# Patient Record
Sex: Female | Born: 1965 | Race: White | Hispanic: No | Smoking: Never smoker
Health system: Southern US, Community
[De-identification: ages and names within clinical notes are randomized; demographics above are authoritative.]

## PROBLEM LIST (undated history)

## (undated) DIAGNOSIS — Z8679 Personal history of other diseases of the circulatory system: Secondary | ICD-10-CM

## (undated) DIAGNOSIS — R42 Dizziness and giddiness: Secondary | ICD-10-CM

## (undated) DIAGNOSIS — M25569 Pain in unspecified knee: Secondary | ICD-10-CM

## (undated) DIAGNOSIS — B351 Tinea unguium: Secondary | ICD-10-CM

## (undated) DIAGNOSIS — B977 Papillomavirus as the cause of diseases classified elsewhere: Secondary | ICD-10-CM

## (undated) DIAGNOSIS — G4700 Insomnia, unspecified: Secondary | ICD-10-CM

## (undated) DIAGNOSIS — I10 Essential (primary) hypertension: Secondary | ICD-10-CM

## (undated) DIAGNOSIS — R87619 Unspecified abnormal cytological findings in specimens from cervix uteri: Secondary | ICD-10-CM

## (undated) DIAGNOSIS — F419 Anxiety disorder, unspecified: Secondary | ICD-10-CM

## (undated) DIAGNOSIS — M79603 Pain in arm, unspecified: Secondary | ICD-10-CM

## (undated) DIAGNOSIS — E785 Hyperlipidemia, unspecified: Secondary | ICD-10-CM

## (undated) DIAGNOSIS — I2510 Atherosclerotic heart disease of native coronary artery without angina pectoris: Secondary | ICD-10-CM

## (undated) DIAGNOSIS — K219 Gastro-esophageal reflux disease without esophagitis: Secondary | ICD-10-CM

## (undated) DIAGNOSIS — F1910 Other psychoactive substance abuse, uncomplicated: Secondary | ICD-10-CM

## (undated) HISTORY — DX: Pain in arm, unspecified: M79.603

## (undated) HISTORY — DX: Gastro-esophageal reflux disease without esophagitis: K21.9

## (undated) HISTORY — DX: Pain in unspecified knee: M25.569

## (undated) HISTORY — DX: Other psychoactive substance abuse, uncomplicated: F19.10

## (undated) HISTORY — DX: Insomnia, unspecified: G47.00

## (undated) HISTORY — DX: Hyperlipidemia, unspecified: E78.5

## (undated) HISTORY — DX: Tinea unguium: B35.1

## (undated) HISTORY — DX: Unspecified abnormal cytological findings in specimens from cervix uteri: R87.619

## (undated) HISTORY — DX: Papillomavirus as the cause of diseases classified elsewhere: B97.7

## (undated) HISTORY — DX: Personal history of other diseases of the circulatory system: Z86.79

## (undated) HISTORY — DX: Dizziness and giddiness: R42

## (undated) HISTORY — PX: AORTA SURGERY: SHX548

## (undated) HISTORY — DX: Anxiety disorder, unspecified: F41.9

---

## 2000-02-22 ENCOUNTER — Encounter: Payer: Self-pay | Admitting: Emergency Medicine

## 2000-02-22 ENCOUNTER — Emergency Department (HOSPITAL_COMMUNITY): Admission: EM | Admit: 2000-02-22 | Discharge: 2000-02-22 | Payer: Self-pay | Admitting: Emergency Medicine

## 2004-01-28 ENCOUNTER — Emergency Department (HOSPITAL_COMMUNITY): Admission: EM | Admit: 2004-01-28 | Discharge: 2004-01-28 | Payer: Self-pay | Admitting: Emergency Medicine

## 2005-08-04 ENCOUNTER — Emergency Department (HOSPITAL_COMMUNITY): Admission: EM | Admit: 2005-08-04 | Discharge: 2005-08-04 | Payer: Self-pay | Admitting: Emergency Medicine

## 2006-02-25 ENCOUNTER — Emergency Department (HOSPITAL_COMMUNITY): Admission: EM | Admit: 2006-02-25 | Discharge: 2006-02-25 | Payer: Self-pay

## 2006-06-06 ENCOUNTER — Emergency Department (HOSPITAL_COMMUNITY): Admission: EM | Admit: 2006-06-06 | Discharge: 2006-06-06 | Payer: Self-pay | Admitting: Emergency Medicine

## 2009-02-09 ENCOUNTER — Inpatient Hospital Stay (HOSPITAL_COMMUNITY): Admission: EM | Admit: 2009-02-09 | Discharge: 2009-02-18 | Payer: Self-pay | Admitting: Emergency Medicine

## 2009-02-09 ENCOUNTER — Encounter: Payer: Self-pay | Admitting: Thoracic Surgery (Cardiothoracic Vascular Surgery)

## 2009-02-09 HISTORY — PX: AORTA SURGERY: SHX548

## 2009-02-10 ENCOUNTER — Ambulatory Visit: Payer: Self-pay | Admitting: Thoracic Surgery (Cardiothoracic Vascular Surgery)

## 2009-02-14 ENCOUNTER — Encounter (INDEPENDENT_AMBULATORY_CARE_PROVIDER_SITE_OTHER): Payer: Self-pay | Admitting: Cardiology

## 2009-03-13 ENCOUNTER — Ambulatory Visit: Payer: Self-pay | Admitting: Thoracic Surgery (Cardiothoracic Vascular Surgery)

## 2009-03-13 ENCOUNTER — Encounter
Admission: RE | Admit: 2009-03-13 | Discharge: 2009-03-13 | Payer: Self-pay | Admitting: Thoracic Surgery (Cardiothoracic Vascular Surgery)

## 2009-04-06 ENCOUNTER — Encounter (HOSPITAL_COMMUNITY): Admission: RE | Admit: 2009-04-06 | Discharge: 2009-06-20 | Payer: Self-pay | Admitting: Cardiology

## 2009-05-02 ENCOUNTER — Ambulatory Visit: Payer: Self-pay | Admitting: Thoracic Surgery (Cardiothoracic Vascular Surgery)

## 2009-05-02 ENCOUNTER — Ambulatory Visit (HOSPITAL_COMMUNITY)
Admission: RE | Admit: 2009-05-02 | Discharge: 2009-05-02 | Payer: Self-pay | Admitting: Thoracic Surgery (Cardiothoracic Vascular Surgery)

## 2009-08-07 ENCOUNTER — Encounter
Admission: RE | Admit: 2009-08-07 | Discharge: 2009-08-07 | Payer: Self-pay | Admitting: Thoracic Surgery (Cardiothoracic Vascular Surgery)

## 2009-08-07 ENCOUNTER — Ambulatory Visit: Payer: Self-pay | Admitting: Thoracic Surgery (Cardiothoracic Vascular Surgery)

## 2010-01-18 ENCOUNTER — Ambulatory Visit: Payer: Self-pay | Admitting: Family Medicine

## 2010-03-18 ENCOUNTER — Emergency Department (HOSPITAL_COMMUNITY): Admission: EM | Admit: 2010-03-18 | Discharge: 2010-03-18 | Payer: Self-pay | Admitting: Emergency Medicine

## 2010-04-12 ENCOUNTER — Ambulatory Visit: Payer: Self-pay | Admitting: Internal Medicine

## 2010-04-16 ENCOUNTER — Emergency Department (HOSPITAL_COMMUNITY): Admission: EM | Admit: 2010-04-16 | Discharge: 2010-04-16 | Payer: Self-pay | Admitting: Emergency Medicine

## 2010-07-14 ENCOUNTER — Other Ambulatory Visit: Payer: Self-pay | Admitting: Thoracic Surgery (Cardiothoracic Vascular Surgery)

## 2010-07-26 ENCOUNTER — Inpatient Hospital Stay: Admission: RE | Admit: 2010-07-26 | Payer: Self-pay | Source: Ambulatory Visit

## 2010-07-26 ENCOUNTER — Encounter: Payer: Self-pay | Admitting: Thoracic Surgery (Cardiothoracic Vascular Surgery)

## 2010-07-26 ENCOUNTER — Ambulatory Visit: Admit: 2010-07-26 | Payer: Self-pay | Admitting: Thoracic Surgery (Cardiothoracic Vascular Surgery)

## 2010-07-26 ENCOUNTER — Other Ambulatory Visit: Payer: Self-pay

## 2010-08-07 ENCOUNTER — Encounter: Payer: Self-pay | Admitting: Thoracic Surgery (Cardiothoracic Vascular Surgery)

## 2010-09-01 ENCOUNTER — Emergency Department (HOSPITAL_COMMUNITY): Payer: Self-pay

## 2010-09-01 ENCOUNTER — Emergency Department (HOSPITAL_COMMUNITY)
Admission: EM | Admit: 2010-09-01 | Discharge: 2010-09-01 | Disposition: A | Discharge status: Home / Self Care | Payer: Self-pay | Attending: Emergency Medicine | Admitting: Emergency Medicine

## 2010-09-01 DIAGNOSIS — I10 Essential (primary) hypertension: Secondary | ICD-10-CM | POA: Insufficient documentation

## 2010-09-01 DIAGNOSIS — Y92009 Unspecified place in unspecified non-institutional (private) residence as the place of occurrence of the external cause: Secondary | ICD-10-CM | POA: Insufficient documentation

## 2010-09-01 DIAGNOSIS — S92909A Unspecified fracture of unspecified foot, initial encounter for closed fracture: Secondary | ICD-10-CM | POA: Insufficient documentation

## 2010-09-01 DIAGNOSIS — R296 Repeated falls: Secondary | ICD-10-CM | POA: Insufficient documentation

## 2010-09-01 DIAGNOSIS — M79609 Pain in unspecified limb: Secondary | ICD-10-CM | POA: Insufficient documentation

## 2010-09-05 LAB — URINALYSIS, ROUTINE W REFLEX MICROSCOPIC
Bilirubin Urine: NEGATIVE
Glucose, UA: NEGATIVE mg/dL
Hgb urine dipstick: NEGATIVE
Ketones, ur: NEGATIVE mg/dL
Nitrite: NEGATIVE
Protein, ur: NEGATIVE mg/dL
Specific Gravity, Urine: 1.006 (ref 1.005–1.030)
Urobilinogen, UA: 0.2 mg/dL (ref 0.0–1.0)
pH: 6 (ref 5.0–8.0)

## 2010-09-05 LAB — BASIC METABOLIC PANEL
BUN: 10 mg/dL (ref 6–23)
CO2: 26 mEq/L (ref 19–32)
Calcium: 8.7 mg/dL (ref 8.4–10.5)
Chloride: 108 mEq/L (ref 96–112)
Creatinine, Ser: 1.12 mg/dL (ref 0.4–1.2)
GFR calc Af Amer: >60 mL/min (ref >60)
GFR calc non Af Amer: 53 mL/min — ABNORMAL LOW (ref >60)
Glucose, Bld: 107 mg/dL — ABNORMAL HIGH (ref 70–99)
Potassium: 5.2 mEq/L — ABNORMAL HIGH (ref 3.5–5.1)
Sodium: 138 mEq/L (ref 135–145)

## 2010-09-05 LAB — CBC
HCT: 36.9 % (ref 36.0–46.0)
Hemoglobin: 12.4 g/dL (ref 12.0–15.0)
MCH: 30.8 pg (ref 26.0–34.0)
MCHC: 33.6 g/dL (ref 30.0–36.0)
MCV: 91.8 fL (ref 78.0–100.0)
Platelets: 285 10*3/uL (ref 150–400)
RBC: 4.02 MIL/uL (ref 3.87–5.11)
RDW: 12.9 % (ref 11.5–15.5)
WBC: 8.3 10*3/uL (ref 4.0–10.5)

## 2010-09-05 LAB — DIFFERENTIAL
Basophils Absolute: 0 10*3/uL (ref 0.0–0.1)
Basophils Relative: 0 % (ref 0–1)
Eosinophils Absolute: 0.1 10*3/uL (ref 0.0–0.7)
Eosinophils Relative: 2 % (ref 0–5)
Lymphocytes Relative: 15 % (ref 12–46)
Monocytes Absolute: 0.5 10*3/uL (ref 0.1–1.0)
Neutro Abs: 6.4 10*3/uL (ref 1.7–7.7)

## 2010-09-05 LAB — POCT PREGNANCY, URINE: Preg Test, Ur: NEGATIVE

## 2010-09-18 ENCOUNTER — Encounter (INDEPENDENT_AMBULATORY_CARE_PROVIDER_SITE_OTHER): Payer: Self-pay | Admitting: *Deleted

## 2010-09-18 LAB — CONVERTED CEMR LAB
Albumin: 4.5 g/dL (ref 3.5–5.2)
CO2: 25 meq/L (ref 19–32)
Chloride: 101 meq/L (ref 96–112)
Creatinine, Ser: 1.05 mg/dL (ref 0.40–1.20)
Total Bilirubin: 0.4 mg/dL (ref 0.3–1.2)
Total Protein: 7.5 g/dL (ref 6.0–8.3)

## 2010-09-29 LAB — CBC
HCT: 25.5 % — ABNORMAL LOW (ref 36.0–46.0)
HCT: 29.6 % — ABNORMAL LOW (ref 36.0–46.0)
HCT: 30 % — ABNORMAL LOW (ref 36.0–46.0)
HCT: 43.3 % (ref 36.0–46.0)
Hemoglobin: 10.1 g/dL — ABNORMAL LOW (ref 12.0–15.0)
Hemoglobin: 10.6 g/dL — ABNORMAL LOW (ref 12.0–15.0)
Hemoglobin: 14.4 g/dL (ref 12.0–15.0)
Hemoglobin: 8.8 g/dL — ABNORMAL LOW (ref 12.0–15.0)
MCHC: 33.3 g/dL (ref 30.0–36.0)
MCHC: 33.8 g/dL (ref 30.0–36.0)
MCHC: 34.1 g/dL (ref 30.0–36.0)
MCV: 84.8 fL (ref 78.0–100.0)
MCV: 85.9 fL (ref 78.0–100.0)
MCV: 87.4 fL (ref 78.0–100.0)
Platelets: 110 10*3/uL — ABNORMAL LOW (ref 150–400)
Platelets: 137 10*3/uL — ABNORMAL LOW (ref 150–400)
RBC: 3.02 MIL/uL — ABNORMAL LOW (ref 3.87–5.11)
RBC: 3.43 MIL/uL — ABNORMAL LOW (ref 3.87–5.11)
RBC: 3.58 MIL/uL — ABNORMAL LOW (ref 3.87–5.11)
RBC: 5.04 MIL/uL (ref 3.87–5.11)
RDW: 16 % — ABNORMAL HIGH (ref 11.5–15.5)
RDW: 16.3 % — ABNORMAL HIGH (ref 11.5–15.5)
RDW: 16.4 % — ABNORMAL HIGH (ref 11.5–15.5)
WBC: 6.5 10*3/uL (ref 4.0–10.5)
WBC: 9.8 10*3/uL (ref 4.0–10.5)

## 2010-09-29 LAB — CROSSMATCH: Antibody Screen: NEGATIVE

## 2010-09-29 LAB — POCT I-STAT 4, (NA,K, GLUC, HGB,HCT)
Glucose, Bld: 137 mg/dL — ABNORMAL HIGH (ref 70–99)
Glucose, Bld: 141 mg/dL — ABNORMAL HIGH (ref 70–99)
Glucose, Bld: 149 mg/dL — ABNORMAL HIGH (ref 70–99)
Glucose, Bld: 156 mg/dL — ABNORMAL HIGH (ref 70–99)
Glucose, Bld: 180 mg/dL — ABNORMAL HIGH (ref 70–99)
Glucose, Bld: 83 mg/dL (ref 70–99)
HCT: 22 % — ABNORMAL LOW (ref 36.0–46.0)
HCT: 31 % — ABNORMAL LOW (ref 36.0–46.0)
HCT: 38 % (ref 36.0–46.0)
Hemoglobin: 12.9 g/dL (ref 12.0–15.0)
Hemoglobin: 6.8 g/dL — CL (ref 12.0–15.0)
Hemoglobin: 6.8 g/dL — CL (ref 12.0–15.0)
Hemoglobin: 7.1 g/dL — CL (ref 12.0–15.0)
Hemoglobin: 8.2 g/dL — ABNORMAL LOW (ref 12.0–15.0)
Potassium: 2.8 mEq/L — ABNORMAL LOW (ref 3.5–5.1)
Potassium: 3 mEq/L — ABNORMAL LOW (ref 3.5–5.1)
Potassium: 3.3 mEq/L — ABNORMAL LOW (ref 3.5–5.1)
Potassium: 3.4 mEq/L — ABNORMAL LOW (ref 3.5–5.1)
Potassium: 3.8 mEq/L (ref 3.5–5.1)
Sodium: 125 mEq/L — ABNORMAL LOW (ref 135–145)
Sodium: 125 mEq/L — ABNORMAL LOW (ref 135–145)
Sodium: 130 mEq/L — ABNORMAL LOW (ref 135–145)
Sodium: 133 mEq/L — ABNORMAL LOW (ref 135–145)
Sodium: 135 mEq/L (ref 135–145)
Sodium: 137 mEq/L (ref 135–145)
Sodium: 144 mEq/L (ref 135–145)

## 2010-09-29 LAB — POCT I-STAT 3, ART BLOOD GAS (G3+)
Acid-base deficit: 1 mmol/L (ref 0.0–2.0)
Acid-base deficit: 1 mmol/L (ref 0.0–2.0)
Acid-base deficit: 1 mmol/L (ref 0.0–2.0)
Bicarbonate: 23.6 mEq/L (ref 20.0–24.0)
Bicarbonate: 23.7 mEq/L (ref 20.0–24.0)
Bicarbonate: 24 mEq/L (ref 20.0–24.0)
Bicarbonate: 25.1 mEq/L — ABNORMAL HIGH (ref 20.0–24.0)
Bicarbonate: 25.3 mEq/L — ABNORMAL HIGH (ref 20.0–24.0)
O2 Saturation: 100 %
O2 Saturation: 100 %
O2 Saturation: 100 %
Patient temperature: 37.4
TCO2: 22 mmol/L (ref 0–100)
TCO2: 25 mmol/L (ref 0–100)
TCO2: 25 mmol/L (ref 0–100)
pCO2 arterial: 33.6 mmHg — ABNORMAL LOW (ref 35.0–45.0)
pCO2 arterial: 37.3 mmHg (ref 35.0–45.0)
pCO2 arterial: 39.5 mmHg (ref 35.0–45.0)
pCO2 arterial: 41.6 mmHg (ref 35.0–45.0)
pCO2 arterial: 47.5 mmHg — ABNORMAL HIGH (ref 35.0–45.0)
pH, Arterial: 7.34 — ABNORMAL LOW (ref 7.350–7.400)
pH, Arterial: 7.389 (ref 7.350–7.400)
pH, Arterial: 7.41 — ABNORMAL HIGH (ref 7.350–7.400)
pH, Arterial: 7.411 — ABNORMAL HIGH (ref 7.350–7.400)
pO2, Arterial: 350 mmHg — ABNORMAL HIGH (ref 80.0–100.0)
pO2, Arterial: 439 mmHg — ABNORMAL HIGH (ref 80.0–100.0)
pO2, Arterial: 62 mmHg — ABNORMAL LOW (ref 80.0–100.0)
pO2, Arterial: 99 mmHg (ref 80.0–100.0)

## 2010-09-29 LAB — MRSA PCR SCREENING: MRSA by PCR: NEGATIVE

## 2010-09-29 LAB — BASIC METABOLIC PANEL
BUN: 10 mg/dL (ref 6–23)
BUN: 7 mg/dL (ref 6–23)
CO2: 24 mEq/L (ref 19–32)
CO2: 27 mEq/L (ref 19–32)
Calcium: 7.8 mg/dL — ABNORMAL LOW (ref 8.4–10.5)
Chloride: 102 mEq/L (ref 96–112)
Chloride: 105 mEq/L (ref 96–112)
Chloride: 106 mEq/L (ref 96–112)
Creatinine, Ser: 0.62 mg/dL (ref 0.4–1.2)
Creatinine, Ser: 0.69 mg/dL (ref 0.4–1.2)
Creatinine, Ser: 0.81 mg/dL (ref 0.4–1.2)
GFR calc Af Amer: >60 mL/min (ref >60)
GFR calc Af Amer: >60 mL/min (ref >60)
GFR calc non Af Amer: >60 mL/min (ref >60)
Glucose, Bld: 101 mg/dL — ABNORMAL HIGH (ref 70–99)
Glucose, Bld: 138 mg/dL — ABNORMAL HIGH (ref 70–99)
Glucose, Bld: 146 mg/dL — ABNORMAL HIGH (ref 70–99)
Glucose, Bld: 151 mg/dL — ABNORMAL HIGH (ref 70–99)
Potassium: 3.6 mEq/L (ref 3.5–5.1)
Potassium: 3.7 mEq/L (ref 3.5–5.1)
Potassium: 4.1 mEq/L (ref 3.5–5.1)
Sodium: 144 mEq/L (ref 135–145)

## 2010-09-29 LAB — POCT PREGNANCY, URINE: Preg Test, Ur: NEGATIVE

## 2010-09-29 LAB — POCT I-STAT, CHEM 8
BUN: 8 mg/dL (ref 6–23)
Calcium, Ion: 1 mmol/L — ABNORMAL LOW (ref 1.12–1.32)
Calcium, Ion: 1.1 mmol/L — ABNORMAL LOW (ref 1.12–1.32)
Chloride: 104 mEq/L (ref 96–112)
Chloride: 109 mEq/L (ref 96–112)
Creatinine, Ser: 0.8 mg/dL (ref 0.4–1.2)
Glucose, Bld: 107 mg/dL — ABNORMAL HIGH (ref 70–99)
Glucose, Bld: 158 mg/dL — ABNORMAL HIGH (ref 70–99)
HCT: 30 % — ABNORMAL LOW (ref 36.0–46.0)
HCT: 46 % (ref 36.0–46.0)
Hemoglobin: 10.2 g/dL — ABNORMAL LOW (ref 12.0–15.0)
Hemoglobin: 15.6 g/dL — ABNORMAL HIGH (ref 12.0–15.0)
Sodium: 141 mEq/L (ref 135–145)
TCO2: 21 mmol/L (ref 0–100)
TCO2: 22 mmol/L (ref 0–100)
TCO2: 22 mmol/L (ref 0–100)

## 2010-09-29 LAB — RAPID URINE DRUG SCREEN, HOSP PERFORMED
Amphetamines: NOT DETECTED
Barbiturates: NOT DETECTED
Benzodiazepines: NOT DETECTED
Cocaine: POSITIVE — AB

## 2010-09-29 LAB — PREPARE FRESH FROZEN PLASMA

## 2010-09-29 LAB — URINE MICROSCOPIC-ADD ON

## 2010-09-29 LAB — DIFFERENTIAL
Basophils Absolute: 0.1 10*3/uL (ref 0.0–0.1)
Lymphocytes Relative: 17 % (ref 12–46)
Monocytes Absolute: 0.9 10*3/uL (ref 0.1–1.0)
Neutrophils Relative %: 73 % (ref 43–77)

## 2010-09-29 LAB — PROTIME-INR: Prothrombin Time: 12.4 seconds (ref 11.6–15.2)

## 2010-09-29 LAB — POCT I-STAT 3, VENOUS BLOOD GAS (G3P V)
Acid-base deficit: 4 mmol/L — ABNORMAL HIGH (ref 0.0–2.0)
O2 Saturation: 97 %
TCO2: 24 mmol/L (ref 0–100)
pCO2, Ven: 45.3 mmHg (ref 45.0–50.0)
pCO2, Ven: 56.8 mmHg — ABNORMAL HIGH (ref 45.0–50.0)
pH, Ven: 7.244 — ABNORMAL LOW (ref 7.250–7.300)
pO2, Ven: 60 mmHg — ABNORMAL HIGH (ref 30.0–45.0)

## 2010-09-29 LAB — CREATININE, SERUM
Creatinine, Ser: 0.95 mg/dL (ref 0.4–1.2)
GFR calc Af Amer: >60 mL/min (ref >60)
GFR calc non Af Amer: >60 mL/min (ref >60)

## 2010-09-29 LAB — HEMOGLOBIN AND HEMATOCRIT, BLOOD: HCT: 24.1 % — ABNORMAL LOW (ref 36.0–46.0)

## 2010-09-29 LAB — URINALYSIS, ROUTINE W REFLEX MICROSCOPIC
Bilirubin Urine: NEGATIVE
Leukocytes, UA: NEGATIVE
Urobilinogen, UA: 0.2 mg/dL (ref 0.0–1.0)

## 2010-09-29 LAB — GLUCOSE, CAPILLARY
Glucose-Capillary: 120 mg/dL — ABNORMAL HIGH (ref 70–99)
Glucose-Capillary: 127 mg/dL — ABNORMAL HIGH (ref 70–99)
Glucose-Capillary: 129 mg/dL — ABNORMAL HIGH (ref 70–99)
Glucose-Capillary: 130 mg/dL — ABNORMAL HIGH (ref 70–99)
Glucose-Capillary: 152 mg/dL — ABNORMAL HIGH (ref 70–99)
Glucose-Capillary: 216 mg/dL — ABNORMAL HIGH (ref 70–99)

## 2010-09-29 LAB — PREPARE PLATELETS

## 2010-09-29 LAB — MAGNESIUM: Magnesium: 2.3 mg/dL (ref 1.5–2.5)

## 2010-09-29 LAB — PLATELET COUNT: Platelets: 139 10*3/uL — ABNORMAL LOW (ref 150–400)

## 2010-09-29 LAB — ABO/RH: ABO/RH(D): A POS

## 2010-11-06 NOTE — Consult Note (Signed)
[name], [name]              ACCOUNT NO.:  [id number]   MEDICAL RECORD NO.:  [id number]          PATIENT TYPE:  INP   LOCATION:  3311                         FACILITY:  [facility]   PHYSICIAN:  Francisca December, M.D.  DATE OF BIRTH:  30-Mar-1966   DATE OF CONSULTATION:  DATE OF DISCHARGE:                                 CONSULTATION   REQUESTING PHYSICIAN:  Dr. Charlett Lango.   REASON FOR CONSULTATION:  Hypertension management.   HISTORY OF PRESENT ILLNESS:  Joan [name] is a pleasant 45 year old  woman with a long history of prior hypertension uncontrolled and  untreated secondary to noncompliance.  She presented on February 09, 2009  to [facility] emergency room complaining of persistent unrelenting chest  pain radiating through to her upper back.  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  She was taken urgently to surgery on February 10, 2009, and had a  #28 Hemashield aortic - aortic anastomosis with reimplantation of the  innominate artery.  She is now postop day  #4 and doing extremely well.  She has been recovering without  complication, and is now in the step-down unit.   She admits to a long history of hypertension, but due to financial and  other reasons has never had it treated.  She denies any history of  cardiac disease that she is aware of, and has not had chest pain or  pressure, exertional symptoms prior to her presentation here on the  19th.  She denies dyspnea prior to her operation, although she is having  difficulty breathing now secondary to her chest wall pain.  She has  never had a stroke, and she has not had a kidney disorder that she is  aware of.   PAST MEDICAL HISTORY:  Hypertension.   MEDICATIONS:  1. Lisinopril 20 mg p.o. daily.  2. Metoprolol 75 mg p.o. b.i.d.  3. Clonidine 0.3 mg b.i.d.   DRUG  ALLERGIES:  None known.   FAMILY HISTORY:  Her mother died at age 8 of myocardial infarction, but  had been a long-time smoker and had severe emphysema.  Her father is  alive and well.  Suffers from anxiety.  Her brother has bipolar  disorder.   SOCIAL HISTORY:  She is a nonsmoker, drinks an occasional pina colada.  No drug use.  She used to work cleaning houses.  Currently unemployed.  She is not a high school graduate, but is working on her GED.   REVIEW OF SYSTEMS:  She denies any chronic headache or visual changes.  No cough or hemoptysis.  No difficulty swallowing.  No significant  dental problems.  She has not had any swollen lymph glands that she is  aware of.  She denies a cough or hemoptysis prior to her surgery.  No  wheezing.  No chest pain, pressure or heaviness.  She has not been short  of breath with daily exertions.  No lower extremity edema.  No abdominal  pain, constipation or diarrhea.  No neck, hematochezia, hematemesis or  melena.  No dysuria or hematuria.  No significant muscle aches or  weakness.  No significant joint pain.  No hypesthesia or paresthesia.  She has never had a stroke that she is aware of, and denies depression  or anxiety.   PHYSICAL EXAMINATION:  Blood pressure today is 138/86, pulse is 82 and  regular, respiratory rate is 28, temperature 98.1, O2 saturation 95% on  2 liters.  GENERAL:  This is a pleasant well-appearing 45 year old  Caucasian woman in no distress, well-kempt.  HEENT:  Unremarkable.  Head is atraumatic and normocephalic.  Pupils are  equal, round, and reactive to light.  Sclerae are anicteric.  Oral  mucosa is pink and moist.  Tongue is not coated.  NECK:  Supple without thyromegaly or masses.  The carotid upstrokes are  normal without bruit.  There is no JVD.  CHEST:  Has diminished breath sounds bilaterally due to pleuritic pain  from her surgical site.  No wheezes or rhonchi.  HEART:  Has regular rhythm, normal S1 and S2 is  heard.  No murmur, click  or rub.  ABDOMEN:  Soft, nontender.  No midline pulsatile mass.  Bowel sounds are  present in all quadrants.  EXTERNAL GENITALIA:  Without lesions.  RECTAL:  Not performed.  EXTREMITIES:  Show full range of motion.  No edema.  Intact distal  pulses.  NEUROLOGICAL:  Cranial nerves II-XII are intact.  Motor and sensory are  grossly intact.  Gait not tested.  SKIN:  Warm, dry, and clear.   ACCESSORY CLINICAL DATA:  Her immediate postop hemogram showed  hemoglobin of 6.8, hematocrit of 20, white blood cell count of 10,400,  platelets 296,000.  Following 2 units of packed red blood cells, the hemoglobin is now 10.9.  White blood cell count today 13,00, platelets 122,000 today.  CBGs have  all been mildly elevated in the 120 range.  Serum electrolytes are  normal.  BUN is 10, creatinine 0.62.  Urinalysis is negative.   Electrocardiogram shows sinus rhythm, incomplete right bundle branch  block, nonspecific ST-T wave abnormality.  There is slight depression in  lead II, V4, and V5.   ASSESSMENT:  1. Type 1 aortic dissection status post recent repair as described      above.  2. History of uncontrolled hypertension.  3. Chronic noncompliance.  4. The patient did have an episode of accelerated idioventricular      rhythm (AIVR) 16 beats asymptomatic.  5. Abnormal ECG as described above.   PLAN:  1. The patient admits at this time that she is motivated to pursue      treatment for hypertension, and I have described the importance of      this at length.  2. Will obtain dietary consult for salt avoidance.  3. Continue lisinopril and beta blocker.  4. DC clonidine after taper.  Begin calcium channel blocker.  5. Add low dose diuretic.  6. Check 2-D echocardiogram.  7. Consider adenosine Myoview to rule out associated CAD.       Francisca December, M.D.    ______________________________  Francisca December, M.D.    JHE/MEDQ  D:  02/14/2009  T:  02/14/2009   Job:  [id number]   cc:   Salvatore Decent. Dorris Fetch, M.D.

## 2010-11-06 NOTE — Assessment & Plan Note (Signed)
OFFICE VISIT   Joan [name], Joan [name]  DOB:  07-14-1965                                        March 13, 2009  CHART #:  [id number]   REASON FOR VISIT:  Follow up after recent surgery.   The patient is a 45 year old woman who presented with an aortic  dissection.  This was relatively limited area, it was just distal to the  takeoff of the innominate artery, it involved the arch in the distal  ascending aorta.  We did a graft repair under deep hyperthermic  circulatory arrest on August 20, one month ago.  She did well  postoperatively, she did have some problems with blood pressure  management and was seen in consultation by Dr. Corliss Marcus, and was  finally discharged home on amlodipine 10 mg daily, aspirin 325 mg daily,  clonidine 0.3 mg daily, hydrochlorothiazide 25 mg daily, lisinopril 40  mg daily and Lopressor 100 mg b.i.d.  She has recently been having some  problems with feeling dizzy.  She was taking all of her blood pressure  medications at one time and would just feel very weak and lightheaded.  She saw Dr. Amil Amen a few days ago and had stopped everything except the  clonidine and the metoprolol.   She is having some incisional pain.  She is taking oxycodone still once  or twice a day for that.  She does have some numbness in her right hand  and some limited mobility in the right index finger.   She denies any cocaine use since hospitalization.   PHYSICAL EXAMINATION:  General:  The patient is a 45 year old white  female in no acute distress.  Vital Signs:  Her blood pressure is  103/71, pulse is 50, respirations 18, oxygen saturation is 95%.  Cardiac:  Bradycardic but regular with a 2/6 murmur.  There is no  carotid bruits.  Extremities:  Peripheral pulses are intact.  She does  have limited range of motion of the thumb and index finger of the right  hand, decreased sensation on the third, fourth and fifth fingers of the  right hand.   Chest x-ray shows good aeration of the lungs bilaterally, there is no  effusions.   IMPRESSION:  The patient is a 45 year old woman who is status post  repair of an aortic dissection.  She has really done exceptionally well  with this although, blood pressure control was a problem.  Her blood  pressure is well-controlled today.  Unfortunately, she is still feeling  very weak and dizzy and her heart rates are quite slow at 50 beats per  minute and she is on a 100 mg of Lopressor b.i.d.  I advised her to  decrease the Lopressor to 50 mg b.i.d., and continue take the clonidine  but restart the hydrochlorothiazide and lisinopril that may give her a  little bit more heart rate, we still need to maintain tight blood  pressure control.  Regarding the findings in her right hand that is  consistent with brachial plexopathy, a combination of sternal retraction  as well as the axillary artery cannulation, but overall, I am extremely  pleased with her progress.  She knows the importance of blood pressure  control and lifestyle modification.  I am going to plan to see her back  in about 2 months, we will  repeat a CT angio at that time to see if  there is any residual areas of false lumen that will need to be followed  long term.  I did give her a prescription for an additional 30 oxycodone  tablets 5 mg p.o. up to three times daily as needed for pain.   Salvatore Decent Dorris Fetch, M.D.  Electronically Signed   SCH/MEDQ  D:  03/13/2009  T:  03/14/2009  Job:  [id number]

## 2010-11-06 NOTE — Assessment & Plan Note (Signed)
OFFICE VISIT   MCMILLER, Hebah L  DOB:  05-Jul-1965                                        August 07, 2009  CHART #:  [id number]   HISTORY:  The patient returns with for a 50-month followup scan.  She had  repair of a type 1 dissection which really just involved the short  segment of proximal arch, this was back in August of last year.  I last  saw her in the office in November, at which time we did a CT and she was  doing well.  There was a question of some residual false lumen on the CT  scans.  We rescanned her today.  She states that when they were trying  to do the CT scan they had to stick her several times and then she was a  little delayed and she was very anxious to get up to the office as she  was running late.  She has been taking her medication, but she says she  has been having some trouble sleeping and wanted a medication for that.  She has not yet found a regular medical doctor.   CURRENT MEDICATIONS:  1. Aspirin 325 mg daily.  2. Clonidine 0.3 mg b.i.d.  3. Hydrochlorothiazide 25 mg daily.  4. Lisinopril 40 mg daily.  5. Her metoprolol had been discontinued by Dr. Amil Amen prior to her      last visit.   PHYSICAL EXAMINATION:  The patient is a 45 year old white female in no  acute distress.  Her blood pressure is 145/93, pulse 67, respirations  are 18, her oxygen saturation is 98% on room air.  Cardiac exam has  regular rate and rhythm.  There is a 2/6 systolic ejection murmur.  She  has no carotid bruits.  She has 2+ pulses throughout.  There is no  peripheral edema.   DIAGNOSTIC TESTS:  CT scan is reviewed.  It is has not been officially  read yet but does not appear to be significantly changed from her last  film.   IMPRESSION:  The patient is a 45 year old woman status post repair of a  type 1 dissection involving the aortic arch.  She is doing well at this  point in time.  Her blood pressure was up, but I am going to give her  the benefit.  She says she was very stressed and anxious and had been  stuck multiple times down the CT scanner, so I am not going to change  her medications at this point in time.  She was asking for something, I  gave her a prescription for Ambien 10 mg 1 tablet at bedtime p.r.n., 30  tablets with 3 refills.  I will plan to see her back in 1 year.  We will  rescan her chest at that time to assess her aorta once again.    Salvatore Decent Dorris Fetch, M.D.  Electronically Signed   SCH/MEDQ  D:  08/07/2009  T:  08/08/2009  Job:  [id number]

## 2010-11-06 NOTE — Op Note (Signed)
NAMENEEMA, BARREIRA              ACCOUNT NO.:  [id number]   MEDICAL RECORD NO.:  [id number]          PATIENT TYPE:  INP   LOCATION:  3311                         FACILITY:  [facility]   PHYSICIAN:  Salvatore Decent. Dorris Fetch, M.D.DATE OF BIRTH:  Jul 09, 1965   DATE OF PROCEDURE:  02/10/2009  DATE OF DISCHARGE:                               OPERATIVE REPORT   PREOPERATIVE DIAGNOSIS:  Type 1 aortic dissection.   POSTOPERATIVE DIAGNOSIS:  Dissection of aortic arch just distal to the  innominate artery.   PROCEDURE:  Axillary artery cannulation via 8-mm Hemashield graft,  median sternotomy, extracorporeal circulation with deep hypothermic  circulatory arrest, repair of aortic dissection.   SURGEON:  Salvatore Decent. Dorris Fetch, MD   ASSISTANT:  Rowe Clack, PA-C   ANESTHESIA:  General.   FINDINGS:  Transesophageal echocardiography revealed no aortic  insufficiency.  There was a markedly hypertrophied left ventricle but  with preserved systolic function.  A circumferential tear of the aortic  intima just distal to the innominate artery and proximal to the takeoff  of the left common carotid artery.  No re-entry tears.  Aortic root  uninvolved.   CLINICAL NOTE:  Ms. Teo is a 45 year old woman with a history of  untreated hypertension who presented on February 09, 2009, with a 24-hour  history of chest pain and hypertensive crisis.  A CT revealed a type 1  aortic dissection and the patient was advised to undergo emergent  repair.  The indications, risks, benefits, and alternatives were  discussed in detail with the patient.  She understood and accepted the  risks and agreed to proceed.  She was taken to the operating room as  soon as it was available.   DESCRIPTION OF PROCEDURE:  On arrival to the operating room, Anesthesia  placed arterial blood pressure monitoring line and a Swan-Ganz catheter.  Intravenous antibiotics were administered.  The patient was anesthetized  and intubated.  A  Foley catheter was placed.  The chest, abdomen, and  legs were prepped and draped in usual fashion.  Transesophageal  echocardiography was performed that showed no valvular pathology.  No  tear was visible in the aorta from the transesophageal echo angle,  although the ascending aorta was enlarged above the sinotubular  junction.   An incision was made in the right deltopectoral groove.  It was carried  through the skin and subcutaneous tissue.  Hemostasis was achieved with  electrocautery.  The axillary artery was dissected out in the  deltopectoral groove.  No cautery was used in the vicinity of the  brachial plexus, which was identified and preserved.  Proximal and  distal control was obtained on the axillary artery.  The patient was  given 5000 units of heparin.  The artery was clamped proximally and  distally and arteriotomy was made and an 8-mm Hemashield graft was sewn  end-to-side to the axillary artery with a running 5-0 Prolene suture.  The clamps were removed.  The graft was de-aired and was connected to  the cardiopulmonary bypass circuit for arterial inflow.   A median sternotomy was performed.  Hemostasis was achieved.  The  remainder of the full heparin dose was given.  The pericardium was  opened.  There was no pericardial effusion.  The ascending aorta was  enlarged, but there was no hematoma on the visible portion of the  ascending aorta.  The remainder of the full heparin dose was given.  A  dual-stage venous cannula was placed via pursestring suture in the right  atrial appendage.  Left ventricular vent was placed via pursestring  suture in the right superior pulmonary vein and a retrograde  cardioplegic cannula was placed via pursestring suture in the right  atrium and directed in the coronary sinus.  Cardiopulmonary bypass was  instituted.  Anticoagulation was monitored with ACT measurement.  Flows  were maintained per protocol.  Immediately cooling was begun.   Dissection then was carried out superiorly and the innominate vein was  dissected free.  The innominate artery was exposed and a tape placed  around the innominate artery.  The adventitial hematoma was evident as  the pericardium was dissected off the underside of the arch and along  the innominate vein.  Antegrade cardioplegic cannula was placed in the  ascending aorta.  The aorta was cross-clamped.  The left ventricle was  emptied via the left ventricular vent.  Cardiac arrest was achieved with  a combination of cold antegrade and retrograde blood cardioplegia and  topical iced saline.  There was a rapid diastolic arrest and myocardial  septal cooling to less than 10 degrees Celsius.  Additional cardioplegia  was administered as needed to maintain the myocardial septal temperature  less than 10 degrees Celsius throughout the portion of the procedure,  where there was no myocardial perfusion.  This was done via the  retrograde cannula.   The aorta was transected just above the sinotubular junction.  The  sinuses of Valsalva and aortic root and aortic valve were all normal in  size with no evident pathology.  Aorta sized for a 28-mm Hemashield  graft.  The proximal anastomosis was performed to a segment of the  Hemashield graft with a running 4-0 Prolene suture using Teflon-felt  pledgets to buttress the aortic side.   At this point in time, the patient cooled to 18 degrees Celsius.  Phenobarbital and steroids were administered.  The head was packed in  ice.  The patient was placed in steep Trendelenburg position.  The  patient was exsanguinated.  A deep hypothermic circulatory arrest was  performed and the cross-clamp was removed from the ascending aorta.  The  ascending aorta then was inspected and divided up to the level of the  proximal arch.  There was a circumferential tear between the takeoff of  the innominate and the left common carotid arteries.  There was a  hematoma  adventitia in the arch.  There were no distal re-entry tears in  the arch.  No other intimal defects.  Aorta was divided at the site of  the intimal tear.  A small button of aorta was left at the site around  the origin of the innominate artery for anastomosis into the graft  separately.  BioGlue was applied to the arch to reapproximate the  intimal and adventitial layers.  After applying the BioGlue, a clamp was  placed on the innominate artery and antegrade cerebral perfusion was  administered via the axillary graft.  The distal anastomosis was  performed with a running 4-0 Prolene suture.  Again, Teflon felt was  used to buttress the distal suture line.  There  was a good size match  between the graft and the aortic arch.  The graft was cut so that the  side branch could be used to anastomose the innominate artery.  The  distal anastomosis was completed.  The side branch then was cut to  length on the graft and the innominate was trimmed.  This anastomosis  then was performed with a running 5-0 Prolene suture.  At the completion  of the innominate anastomosis with the patient still in Trendelenburg  position, the flow was gradually restored de-airing through the aortic  graft.  After the graft was de-aired, a cross-clamp was placed across  the graft just proximal to the innominate anastomosis and the clamp was  removed from the innominate artery reestablishing flow to the remainder  of the body.  Circulatory arrest time was 51 minutes.  Several bleeding  sites were repaired with 4-0 Prolene horizontal mattress sutures with  pledgets.  While the patient was being rewarmed, the graft was trimmed  to length and a graft-to-graft anastomosis was performed with a running  4-0 Prolene suture.  After completion of this anastomosis, the aortic  cross-clamp was removed.  Prior to removing the cross-clamp, a warm dose  of retrograde cardioplegia was administered via the retrograde cannula  and the  aortic graft was de-aired.  The cross-clamp time was 26 minutes  prior to circulatory arrest and 25 minutes following circulatory arrest.  The heart remained decompressed via the left ventricular vent.  Rewarming was begun initially after reestablishing circulation, but did  take a considerable period of time.  All anastomoses were inspected for  hemostasis.  When the patient had rewarmed to a core temperature of 37  degrees Celsius, epicardial pacing wires were placed on the right  ventricle and right atrium.  Additional de-airing was performed via the  aortic graft until no more air was visible.  Left ventricular vent then  was removed.  Again no more air was visible on the transesophageal  echocardiogram.  The patient was in sinus rhythm.  When she reached 37  degrees Celsius, she was weaned from cardiopulmonary bypass on the first  attempt.  Total bypass time was 133 minutes with a total of 184 minutes  since the initiation of bypass including the circulatory arrest time.  All anastomoses were inspected for hemostasis.  A test dose of protamine  was administered and was well tolerated.  Remaining of the protamine was  administered without incident.  Transesophageal echocardiography  revealed no change in left ventricular hypertrophy.  There was no  valvular pathology.  There was preserved left ventricular function.  FloSeal was applied as well as Surgicel to each of the anastomoses.  Hemostasis was achieved.  Platelets, fresh frozen plasma, and packed red  blood cells were administered to correct coagulopathy and anemia.  After  no more volume administration via the axillary artery was necessary,  this graft was clamped, divided, and oversewn with a running 5-0 Prolene  suture.  There was a good pulse in the axillary artery distal to the  anastomosis.   The proximal portion of the pericardium was reapproximated with  interrupted 3-0 silk sutures.  Two mediastinal chest tubes were  placed  through separate subcostal incisions.  Sternum was closed with  interrupted heavy gauge stainless steel wires.  The pectoralis fascia,  subcutaneous tissue, and skin were closed in standard fashion.  The  axillary incision was closed in 2 layers with a subcuticular skin  closure.  All sponge,  needle, and instrument counts were correct at the  end of procedure.  The patient was taken from the operating room to the  surgical intensive care unit in critical condition.      Salvatore Decent Dorris Fetch, M.D.  Electronically Signed     SCH/MEDQ  D:  02/14/2009  T:  02/15/2009  Job:  [id number]

## 2010-11-06 NOTE — Discharge Summary (Signed)
Joan [name], Joan [name]              ACCOUNT NO.:  [id number]   MEDICAL RECORD NO.:  [id number]          PATIENT TYPE:  INP   LOCATION:  2032                         FACILITY:  [facility]   PHYSICIAN:  Salvatore Decent. Dorris Fetch, M.D.DATE OF BIRTH:  December 29, 1965   DATE OF ADMISSION:  02/09/2009  DATE OF DISCHARGE:                               DISCHARGE SUMMARY   ADDENDUM  The patient was tentatively ready for discharge to home on February 16, 2009.  The blood pressure remained elevated.  Her medications have been  adjusted day prior.  She was seen again by Cardiology and they switched  her clonidine to transdermal form.  He also ordered a renal artery  ultrasound.  Currently this is pending.  The patient's blood pressure  has improved this morning.  She is tentatively ready for discharge to  home later this afternoon.  For details of the patient's followup  appointments and discharge instructions, please see dictated discharge  summary.   DISCHARGE MEDICATIONS:  1. Norvasc 10 mg daily.  2. Aspirin 325 mg daily.  3. Clonidine 0.3 mg per 24 hours patch transdermally to change weekly      on Thursdays.  4. Hydrochlorothiazide 25 mg daily.  5. Lisinopril 40 mg daily.  6. Metoprolol 100 mg b.i.d.  7. Oxycodone 5 mg 1-2 tablets q.4-6 h. p.r.n. pain.      Sol Blazing, PA      Salvatore Decent. Dorris Fetch, M.D.  Electronically Signed    KMD/MEDQ  D:  02/17/2009  T:  02/17/2009  Job:  [id number]   cc:   Dr. Amil Amen

## 2010-11-06 NOTE — Assessment & Plan Note (Signed)
OFFICE VISIT   [name], [name]  DOB:  1966/06/06                                        May 02, 2009  CHART #:  [id number]   REASON FOR FOLLOWUP:  Three-month follow up after aortic dissection.   HISTORY:  The patient is a 45 year old woman who presented back in  August with an aortic dissection.  It was kind of unusual.  It started  right at the innominate.  It was technically a type 1 but just involved  a short segment of the aortic arch.  She had that repaired emergently.  Blood pressure was difficult to manage postoperatively, but ultimately  we did get her on a good regimen, and with the help with Dr. Amil Amen she  was discharged.  I saw her back in the office on March 13, 2009, at  which time she was doing well.  Her biggest complaint was related to  problems with her right arm moving and lifting it and some numbness in  her right hand.  She states that has all resolved except for some  residual numbness in the lateral aspect of her right hand, but her range  of motion has returned, and she really feels that is much improved.  She  still does get some incisional discomfort if she coughs or sneezes, but  by large does not have any issues with that.  She says that she has not  used cocaine since she was hospitalized.   CURRENT MEDICATIONS:  1. Lisinopril 40 mg daily.  2. Amlodipine 10 mg daily.  3. Aspirin 325 mg daily.  4. Clonidine 0.3 mg b.i.d.   Metoprolol and hydrochlorothiazide were discontinued.   ALLERGIES:  She has no new allergies.   REVIEW OF SYSTEMS:  See HPI, otherwise negative.   PHYSICAL EXAMINATION:  The patient is a 45 year old thin white female in  no acute distress.  Neurologically, she is alert and oriented x3 with no  focal deficits.  Her cardiac exam has a regular rate and rhythm.  Normal  S1 and S2.  There is a faint 2/6 systolic murmur.  Lungs are clear.  She  has no carotid bruits.  Sternum is stable.  Sternal  incision is well  healed.  She has no peripheral edema.  She has 2+ pulses throughout.   DIAGNOSTIC TESTS:  CT scan is reviewed.  Interestingly, there is some  opacity noted in the area where the false lumen was repaired.  It likely  is due to BioGlue given the distribution on the noncontrasted scans.  This almost gives the appearance of a leak on the contrasted scans, but  this is not precisely where the anastomosis would have been.  Overall, I  think it shows stable aorta with minimal if any residual false lumen,  but there does appear to be likely some degree of false lumen on the  inferior posterior aspect of the arch.   IMPRESSION:  The patient is a 45 year old woman.  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  She has done  extraordinarily well.  Blood pressure is well managed.  I did give a new  prescription for lisinopril 40 mg daily and amlodipine 10 mg daily.  She  will continue to take an aspirin a day as well.  I gave  her 6 refills on  each of those prescriptions.  I do want to plan to see her back in 3  months.  We will rescan her aorta as it does appear there is some  residual false lumen, although I think some of that maybe just shadowing  from the BioGlue given the fact it was there on the noncontrasted scans  prior to the administration of contrast.  We will await the final  radiology reading on that as well.  In the meantime, I once again  stressed the importance of avoiding any illicit drug use, watching her  diet and exercise.  She has been doing cardiac rehab.  She also has an  appointment with Dr. Amil Amen in December.   Salvatore Decent Dorris Fetch, M.D.  Electronically Signed   SCH/MEDQ  D:  05/02/2009  T:  05/02/2009  Job:  [id number]   cc:   Francisca December, M.D.

## 2011-05-18 ENCOUNTER — Encounter: Payer: Self-pay | Admitting: *Deleted

## 2011-05-18 ENCOUNTER — Emergency Department (HOSPITAL_COMMUNITY)
Admission: EM | Admit: 2011-05-18 | Discharge: 2011-05-18 | Disposition: A | Discharge status: Home / Self Care | Payer: Self-pay | Attending: Emergency Medicine | Admitting: Emergency Medicine

## 2011-05-18 ENCOUNTER — Emergency Department (HOSPITAL_COMMUNITY): Payer: Self-pay

## 2011-05-18 DIAGNOSIS — I10 Essential (primary) hypertension: Secondary | ICD-10-CM | POA: Insufficient documentation

## 2011-05-18 DIAGNOSIS — X500XXA Overexertion from strenuous movement or load, initial encounter: Secondary | ICD-10-CM | POA: Insufficient documentation

## 2011-05-18 DIAGNOSIS — Z79899 Other long term (current) drug therapy: Secondary | ICD-10-CM | POA: Insufficient documentation

## 2011-05-18 DIAGNOSIS — M25569 Pain in unspecified knee: Secondary | ICD-10-CM | POA: Insufficient documentation

## 2011-05-18 DIAGNOSIS — S8391XA Sprain of unspecified site of right knee, initial encounter: Secondary | ICD-10-CM

## 2011-05-18 DIAGNOSIS — IMO0002 Reserved for concepts with insufficient information to code with codable children: Secondary | ICD-10-CM | POA: Insufficient documentation

## 2011-05-18 DIAGNOSIS — M25561 Pain in right knee: Secondary | ICD-10-CM

## 2011-05-18 HISTORY — DX: Essential (primary) hypertension: I10

## 2011-05-18 MED ORDER — HYDROCODONE-ACETAMINOPHEN 5-325 MG PO TABS: ORAL_TABLET | ORAL | Status: AC

## 2011-05-18 NOTE — ED Provider Notes (Signed)
History     CSN: [id number] Arrival date & time: 05/18/2011  2:20 PM      Chief Complaint  Patient presents with  . Knee Pain    HPI Pt was seen at 1740.  Per pt, c/o gradual onset and persistence of constant right knee "pain" that began PTA.  Pt states she was carrying a christmas tree when she stepped off a step trying to get out of a building.  Has been ambulatory since injury.  Denies fall, no direct injury.  Denies focal motor weakness, no tingling/numbness in extremity.     Past Medical History  Diagnosis Date  . Hypertension     Past Surgical History  Procedure Date  . Aorta surgery     History  Substance Use Topics  . Smoking status: Never Smoker   . Smokeless tobacco: Not on file  . Alcohol Use: No    Review of Systems ROS: Statement: All systems negative except as marked or noted in the HPI; Constitutional: Negative for fever and chills. ; ; Eyes: Negative for eye pain, redness and discharge. ; ; ENMT: Negative for ear pain, hoarseness, nasal congestion, sinus pressure and sore throat. ; ; Cardiovascular: Negative for chest pain, palpitations, diaphoresis, dyspnea and peripheral edema. ; ; Respiratory: Negative for cough, wheezing and stridor. ; ; Gastrointestinal: Negative for nausea, vomiting, diarrhea and abdominal pain, blood in stool, hematemesis, jaundice and rectal bleeding. . ; ; Genitourinary: Negative for dysuria, flank pain and hematuria. ; ; Musculoskeletal: Negative for back pain and neck pain. Negative for swelling. +right knee pain.; ; Skin: Negative for pruritus, rash, abrasions, blisters, bruising and skin lesion.; ; Neuro: Negative for headache, lightheadedness and neck stiffness. Negative for weakness, altered level of consciousness , altered mental status, extremity weakness, paresthesias, involuntary movement, seizure and syncope.     Allergies  Review of patient's allergies indicates no known allergies.  Home Medications   Current Outpatient  Rx  Name Route Sig Dispense Refill  . AMLODIPINE BESYLATE 10 MG PO TABS Oral Take 10 mg by mouth daily.      . ASPIRIN 325 MG PO TABS Oral Take 325 mg by mouth daily.      Marland Kitchen CLONIDINE HCL 0.1 MG PO TABS Oral Take 0.1 mg by mouth daily.      Marland Kitchen LISINOPRIL 2.5 MG PO TABS Oral Take 2.5 mg by mouth daily.        BP 78/51  Pulse 62  Temp(Src) 97.4 F (36.3 C) (Oral)  Resp 16  SpO2 97%  LMP 05/06/2011  Physical Exam 1745: Physical examination:  Nursing notes reviewed; Vital signs and O2 SAT reviewed;  Constitutional: Well developed, Well nourished, Well hydrated, In no acute distress; Head:  Normocephalic, atraumatic; Eyes: EOMI, PERRL, No scleral icterus; ENMT: Mouth and pharynx normal, Mucous membranes moist; Neck: Supple, Full range of motion, No lymphadenopathy; Cardiovascular: Regular rate and rhythm, No murmur, rub, or gallop; Respiratory: Breath sounds clear & equal bilaterally, No rales, rhonchi, wheezes, or rub, Normal respiratory effort/excursion; Chest: Nontender, Movement normal; Extremities: Pulses normal, +FROM right knee, including able to lift extended RLE off stretcher, and extend right lower leg against resistance.  No ligamentous laxity.  No patellar or quad tendon step-offs.  NMS intact right foot, strong pedal pp.  No proximal fibular head tenderness.  No edema, erythema, warmth, or ecchymosis.  No specific area of point tenderness.  No left hip or ankle/foot tenderness. No edema, No calf edema or asymmetry.; Neuro: AA&Ox3, Major CN  grossly intact. Speech clear, no facial droop.  No gross focal motor or sensory deficits in extremities.; Skin: Color normal, Warm, Dry, no rash.    ED Course  Procedures   MDM  MDM Reviewed: nursing note and vitals Interpretation: x-ray   Dg Knee Complete 4 Views Right  05/18/2011  *RADIOLOGY REPORT*  Clinical Data: Pain and swelling. No trauma history submitted.  RIGHT KNEE - COMPLETE 4+ VIEW  Comparison: None.  Findings: Moderate  suprapatellar joint effusion. No acute fracture or dislocation.  Suspect minimal patellofemoral degenerative change.  IMPRESSION: Moderate suprapatellar joint effusion, of indeterminate etiology.  Original Report Authenticated By: Consuello Bossier, M.D.     6:12 PM:  States she has crutches at home and refusing any here.  Knee immobilizer applied by Ortho Tech.  Wants to go home now.  Dx testing d/w pt.  Questions answered.  Verb understanding, agreeable to d/c home with outpt f/u.      Naylee Frankowski Allison Quarry, DO 05/20/11 [phone]

## 2011-05-18 NOTE — ED Notes (Signed)
Rt. Knee. Trying to get a christmas tree out of the building. Did not take nothing for pain.

## 2011-05-18 NOTE — Progress Notes (Signed)
Orthopedic Tech Progress Note Patient Details:  Joan [name] 08-Oct-1965 [id number]  Other Ortho Devices Type of Ortho Device: Knee Immobilizer Ortho Device Location: right knee Ortho Device Interventions: Application   Nikki Dom 05/18/2011, 5:55 PM

## 2012-02-10 ENCOUNTER — Ambulatory Visit: Payer: No Typology Code available for payment source | Attending: Family Medicine | Admitting: Physical Therapy

## 2012-02-28 ENCOUNTER — Emergency Department (HOSPITAL_COMMUNITY)
Admission: EM | Admit: 2012-02-28 | Discharge: 2012-02-28 | Disposition: A | Discharge status: Home / Self Care | Payer: No Typology Code available for payment source | Attending: Emergency Medicine | Admitting: Emergency Medicine

## 2012-02-28 ENCOUNTER — Encounter (HOSPITAL_COMMUNITY): Payer: Self-pay | Admitting: *Deleted

## 2012-02-28 DIAGNOSIS — Y93I9 Activity, other involving external motion: Secondary | ICD-10-CM | POA: Insufficient documentation

## 2012-02-28 DIAGNOSIS — T07XXXA Unspecified multiple injuries, initial encounter: Secondary | ICD-10-CM | POA: Diagnosis present

## 2012-02-28 DIAGNOSIS — I10 Essential (primary) hypertension: Secondary | ICD-10-CM | POA: Insufficient documentation

## 2012-02-28 DIAGNOSIS — Y998 Other external cause status: Secondary | ICD-10-CM | POA: Insufficient documentation

## 2012-02-28 DIAGNOSIS — I2510 Atherosclerotic heart disease of native coronary artery without angina pectoris: Secondary | ICD-10-CM | POA: Insufficient documentation

## 2012-02-28 DIAGNOSIS — IMO0002 Reserved for concepts with insufficient information to code with codable children: Secondary | ICD-10-CM | POA: Insufficient documentation

## 2012-02-28 HISTORY — DX: Atherosclerotic heart disease of native coronary artery without angina pectoris: I25.10

## 2012-02-28 MED ORDER — ACETAMINOPHEN 325 MG PO TABS
650.0000 mg | ORAL_TABLET | Freq: Once | ORAL | Status: AC
  Administered 2012-02-28: 650 mg via ORAL
  Filled 2012-02-28: qty 2

## 2012-02-28 MED ORDER — TETANUS-DIPHTH-ACELL PERTUSSIS 5-2.5-18.5 LF-MCG/0.5 IM SUSP
0.5000 mL | Freq: Once | INTRAMUSCULAR | Status: AC
  Administered 2012-02-28: 0.5 mL via INTRAMUSCULAR
  Filled 2012-02-28: qty 0.5
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
## 2012-11-20 ENCOUNTER — Ambulatory Visit (HOSPITAL_COMMUNITY)
Admission: RE | Admit: 2012-11-20 | Discharge: 2012-11-20 | Disposition: A | Discharge status: Home / Self Care | Payer: Self-pay | Source: Ambulatory Visit | Attending: Family Medicine | Admitting: Family Medicine

## 2012-11-20 ENCOUNTER — Other Ambulatory Visit (HOSPITAL_COMMUNITY): Payer: Self-pay | Admitting: Family Medicine

## 2012-11-20 DIAGNOSIS — S99929A Unspecified injury of unspecified foot, initial encounter: Secondary | ICD-10-CM | POA: Insufficient documentation

## 2012-11-20 DIAGNOSIS — R262 Difficulty in walking, not elsewhere classified: Secondary | ICD-10-CM | POA: Insufficient documentation

## 2012-11-20 DIAGNOSIS — R52 Pain, unspecified: Secondary | ICD-10-CM

## 2012-11-20 DIAGNOSIS — S8990XA Unspecified injury of unspecified lower leg, initial encounter: Secondary | ICD-10-CM | POA: Insufficient documentation

## 2012-11-20 DIAGNOSIS — W19XXXA Unspecified fall, initial encounter: Secondary | ICD-10-CM | POA: Insufficient documentation

## 2012-11-20 DIAGNOSIS — M25469 Effusion, unspecified knee: Secondary | ICD-10-CM | POA: Insufficient documentation

## 2013-12-13 ENCOUNTER — Encounter (HOSPITAL_COMMUNITY): Payer: Self-pay | Admitting: Emergency Medicine

## 2013-12-13 ENCOUNTER — Emergency Department (HOSPITAL_COMMUNITY)
Admission: EM | Admit: 2013-12-13 | Discharge: 2013-12-13 | Disposition: A | Discharge status: Home / Self Care | Payer: No Typology Code available for payment source | Attending: Emergency Medicine | Admitting: Emergency Medicine

## 2013-12-13 DIAGNOSIS — I2510 Atherosclerotic heart disease of native coronary artery without angina pectoris: Secondary | ICD-10-CM | POA: Insufficient documentation

## 2013-12-13 DIAGNOSIS — R221 Localized swelling, mass and lump, neck: Secondary | ICD-10-CM

## 2013-12-13 DIAGNOSIS — R220 Localized swelling, mass and lump, head: Secondary | ICD-10-CM | POA: Insufficient documentation

## 2013-12-13 DIAGNOSIS — T7840XA Allergy, unspecified, initial encounter: Secondary | ICD-10-CM

## 2013-12-13 DIAGNOSIS — Z9889 Other specified postprocedural states: Secondary | ICD-10-CM | POA: Insufficient documentation

## 2013-12-13 DIAGNOSIS — R21 Rash and other nonspecific skin eruption: Secondary | ICD-10-CM | POA: Insufficient documentation

## 2013-12-13 DIAGNOSIS — I10 Essential (primary) hypertension: Secondary | ICD-10-CM | POA: Insufficient documentation

## 2013-12-13 DIAGNOSIS — Z7982 Long term (current) use of aspirin: Secondary | ICD-10-CM | POA: Insufficient documentation

## 2013-12-13 DIAGNOSIS — Z79899 Other long term (current) drug therapy: Secondary | ICD-10-CM | POA: Insufficient documentation

## 2013-12-13 MED ORDER — DIPHENHYDRAMINE HCL 25 MG PO TABS
25.0000 mg | ORAL_TABLET | Freq: Four times a day (QID) | ORAL | Status: DC
Start: 2013-12-13 — End: 2014-02-05

## 2013-12-13 MED ORDER — FAMOTIDINE 20 MG PO TABS
20.0000 mg | ORAL_TABLET | Freq: Once | ORAL | Status: AC
  Administered 2013-12-13: 20 mg via ORAL
  Filled 2013-12-13: qty 1

## 2013-12-13 MED ORDER — FAMOTIDINE 20 MG PO TABS: 20.0000 mg | ORAL_TABLET | Freq: Two times a day (BID) | ORAL | Status: DC

## 2013-12-13 MED ORDER — DIPHENHYDRAMINE HCL 25 MG PO CAPS
50.0000 mg | ORAL_CAPSULE | Freq: Once | ORAL | Status: AC
  Administered 2013-12-13: 50 mg via ORAL
  Filled 2013-12-13: qty 2

## 2013-12-13 MED ORDER — PREDNISONE 20 MG PO TABS
60.0000 mg | ORAL_TABLET | Freq: Once | ORAL | Status: AC
  Administered 2013-12-13: 60 mg via ORAL
  Filled 2013-12-13: qty 3

## 2013-12-13 MED ORDER — PREDNISONE 20 MG PO TABS: ORAL_TABLET | ORAL | Status: DC

## 2013-12-13 NOTE — Discharge Instructions (Signed)

## 2013-12-13 NOTE — ED Provider Notes (Signed)
CSN: [id number]     Arrival date & time 12/13/13  1215 History  This chart was scribed for non-physician practitioner, Fayrene HelperBowie Gennaro Lizotte, PA-C working with Junius ArgyleForrest S Harrison, MD by Greggory StallionKayla Andersen, ED scribe. This patient was seen in room TR08C/TR08C and the patient's care was started at 12:34 PM.   Chief Complaint  Patient presents with  . Allergic Reaction  . Rash  . Facial Swelling   The history is provided by the patient. No language interpreter was used.   HPI Comments: Joan BatonSharon L Cerutti is a 48 y.o. female who presents to the Emergency Department complaining of facial swelling and an itchy rash to her chest, back, abdomen and legs that started 3 days ago. States her right eye started swelling first then it worsened yesterday. Pt states she was pulling weeds in her yard before her symptoms started so she is unsure if she is having a reaction to something outside. Denies new soaps, detergents, lotions, medications, pets. Denies trouble swallowing, tongue swelling, trouble breathing, SOB, nausea, emesis, diarrhea.   Past Medical History  Diagnosis Date  . Hypertension   . Coronary artery disease    Past Surgical History  Procedure Laterality Date  . Aorta surgery     No family history on file. History  Substance Use Topics  . Smoking status: Never Smoker   . Smokeless tobacco: Not on file  . Alcohol Use: No   OB History   Grav Para Term Preterm Abortions TAB SAB Ect Mult Living                 Review of Systems  HENT: Positive for facial swelling. Negative for trouble swallowing.   Respiratory: Negative for shortness of breath.   Gastrointestinal: Negative for nausea, vomiting and diarrhea.  Skin: Positive for rash.  All other systems reviewed and are negative.  Allergies  Review of patient's allergies indicates no known allergies.  Home Medications   Prior to Admission medications   Medication Sig Start Date End Date Taking? Authorizing Provider  amLODipine (NORVASC) 10 MG  tablet Take 10 mg by mouth daily.      Historical Provider, MD  aspirin 325 MG tablet Take 325 mg by mouth daily.      Historical Provider, MD  Citalopram Hydrobromide (CELEXA PO) Take 1 tablet by mouth daily.    Historical Provider, MD  cloNIDine (CATAPRES) 0.1 MG tablet Take 0.1 mg by mouth daily.      Historical Provider, MD  lisinopril (PRINIVIL,ZESTRIL) 2.5 MG tablet Take 2.5 mg by mouth daily.      Historical Provider, MD  LORazepam (ATIVAN) 0.5 MG tablet Take 0.5 mg by mouth 2 (two) times daily as needed. For anxiety    Historical Provider, MD   BP 140/88  Pulse 80  Temp(Src) 97.9 F (36.6 C) (Oral)  Resp 24  SpO2 93%  LMP 10/13/2013  Physical Exam  Nursing note and vitals reviewed. Constitutional: She is oriented to person, place, and time. She appears well-developed and well-nourished. No distress.  HENT:  Head: Normocephalic and atraumatic.  No tongue swelling. No trismus. Markedly erythematous and edematous to bilateral eye lids and zygomatic arch, extending to lips and chin.  Eyes: Conjunctivae and EOM are normal.  Neck: Neck supple. No tracheal deviation present.  Trachea midline.  Cardiovascular: Normal rate, regular rhythm and normal heart sounds.   Pulmonary/Chest: Effort normal and breath sounds normal. No respiratory distress. She has no wheezes. She has no rales.  Musculoskeletal: Normal range of  motion.  Neurological: She is alert and oriented to person, place, and time.  Skin: Skin is warm and dry.  Hives noted to lower back. Patchy, maculopapular rash in various distributions noted to anterior chest, abdomen and bilateral thighs.   Psychiatric: She has a normal mood and affect. Her behavior is normal.    ED Course  Procedures (including critical care time)  DIAGNOSTIC STUDIES: Oxygen Saturation is 93% on RA, adequate by my interpretation.    COORDINATION OF CARE: 12:41 PM-Discussed treatment plan which includes benadryl, pepcid and prednisone with pt at  bedside and pt agreed to plan. Return precautions given. No airway compromise.  Sxs ongoing for 2-3 days so doubt resp. Complication.  Unknown allergic agent, but could be related to pulling weeds prior to sxs started.    Labs Review Labs Reviewed - No data to display  Imaging Review No results found.   EKG Interpretation None      MDM   Final diagnoses:  Allergic reaction, initial encounter    BP 140/88  Pulse 80  Temp(Src) 97.9 F (36.6 C) (Oral)  Resp 24  SpO2 93%  LMP 10/13/2013   I personally performed the services described in this documentation, which was scribed in my presence. The recorded information has been reviewed and is accurate.  Fayrene HelperBowie Gavan Nordby, PA-C 12/13/13 1245

## 2013-12-13 NOTE — ED Provider Notes (Signed)
Medical screening examination/treatment/procedure(s) were performed by non-physician practitioner and as supervising physician I was immediately available for consultation/collaboration.   EKG Interpretation None        Forrest S Harrison, MD 12/13/13 1925 

## 2013-12-13 NOTE — ED Notes (Signed)
Patient states that she was pulling weeds on Saturday and a Rash developed Saturday night. Rash and swelling noted to her face and lips.  There are hives noted on her lower back.  There is a rash noted on her anterior chest, arms and thighs. Patient states that she has taken nothing for her symptoms.

## 2013-12-13 NOTE — ED Notes (Signed)
Pt. Stated, I've had facial swelling, and a rash for 2 days.  I was pulling weeds a few days ago, so Im not sure if I got into something out there.

## 2014-02-05 ENCOUNTER — Encounter (HOSPITAL_COMMUNITY): Payer: Self-pay | Admitting: Emergency Medicine

## 2014-02-05 ENCOUNTER — Emergency Department (HOSPITAL_COMMUNITY): Payer: Self-pay

## 2014-02-05 ENCOUNTER — Emergency Department (HOSPITAL_COMMUNITY)
Admission: EM | Admit: 2014-02-05 | Discharge: 2014-02-05 | Disposition: A | Discharge status: Home / Self Care | Payer: Self-pay | Attending: Emergency Medicine | Admitting: Emergency Medicine

## 2014-02-05 DIAGNOSIS — R4182 Altered mental status, unspecified: Secondary | ICD-10-CM | POA: Insufficient documentation

## 2014-02-05 DIAGNOSIS — I2510 Atherosclerotic heart disease of native coronary artery without angina pectoris: Secondary | ICD-10-CM | POA: Insufficient documentation

## 2014-02-05 DIAGNOSIS — Z3202 Encounter for pregnancy test, result negative: Secondary | ICD-10-CM | POA: Insufficient documentation

## 2014-02-05 DIAGNOSIS — Y9389 Activity, other specified: Secondary | ICD-10-CM | POA: Insufficient documentation

## 2014-02-05 DIAGNOSIS — Z7982 Long term (current) use of aspirin: Secondary | ICD-10-CM | POA: Insufficient documentation

## 2014-02-05 DIAGNOSIS — I10 Essential (primary) hypertension: Secondary | ICD-10-CM | POA: Insufficient documentation

## 2014-02-05 DIAGNOSIS — IMO0002 Reserved for concepts with insufficient information to code with codable children: Secondary | ICD-10-CM | POA: Insufficient documentation

## 2014-02-05 DIAGNOSIS — R1084 Generalized abdominal pain: Secondary | ICD-10-CM | POA: Insufficient documentation

## 2014-02-05 DIAGNOSIS — S22009A Unspecified fracture of unspecified thoracic vertebra, initial encounter for closed fracture: Secondary | ICD-10-CM | POA: Insufficient documentation

## 2014-02-05 DIAGNOSIS — S22069A Unspecified fracture of T7-T8 vertebra, initial encounter for closed fracture: Secondary | ICD-10-CM

## 2014-02-05 DIAGNOSIS — Y92410 Unspecified street and highway as the place of occurrence of the external cause: Secondary | ICD-10-CM | POA: Insufficient documentation

## 2014-02-05 DIAGNOSIS — Z79899 Other long term (current) drug therapy: Secondary | ICD-10-CM | POA: Insufficient documentation

## 2014-02-05 DIAGNOSIS — R404 Transient alteration of awareness: Secondary | ICD-10-CM | POA: Insufficient documentation

## 2014-02-05 LAB — RAPID URINE DRUG SCREEN, HOSP PERFORMED
AMPHETAMINES: NOT DETECTED
BENZODIAZEPINES: NOT DETECTED
Barbiturates: NOT DETECTED
Cocaine: POSITIVE — AB
OPIATES: NOT DETECTED
Tetrahydrocannabinol: NOT DETECTED

## 2014-02-05 LAB — COMPREHENSIVE METABOLIC PANEL
ALBUMIN: 3.4 g/dL — AB (ref 3.5–5.2)
ALT: 26 U/L (ref 0–35)
ANION GAP: 12 (ref 5–15)
AST: 50 U/L — ABNORMAL HIGH (ref 0–37)
Alkaline Phosphatase: 73 U/L (ref 39–117)
BUN: 12 mg/dL (ref 6–23)
CO2: 27 mEq/L (ref 19–32)
CREATININE: 0.99 mg/dL (ref 0.50–1.10)
Calcium: 8.6 mg/dL (ref 8.4–10.5)
Chloride: 104 mEq/L (ref 96–112)
GFR calc Af Amer: 77 mL/min — ABNORMAL LOW (ref >90)
GFR calc non Af Amer: 66 mL/min — ABNORMAL LOW (ref >90)
Glucose, Bld: 87 mg/dL (ref 70–99)
Potassium: 3.8 mEq/L (ref 3.7–5.3)
SODIUM: 143 meq/L (ref 137–147)
Total Bilirubin: 0.2 mg/dL — ABNORMAL LOW (ref 0.3–1.2)
Total Protein: 6.7 g/dL (ref 6.0–8.3)

## 2014-02-05 LAB — CBC WITH DIFFERENTIAL/PLATELET
BASOS ABS: 0 10*3/uL (ref 0.0–0.1)
Basophils Relative: 0 % (ref 0–1)
EOS ABS: 0.2 10*3/uL (ref 0.0–0.7)
EOS PCT: 5 % (ref 0–5)
HCT: 38.6 % (ref 36.0–46.0)
Hemoglobin: 12.6 g/dL (ref 12.0–15.0)
Lymphocytes Relative: 30 % (ref 12–46)
Lymphs Abs: 1.5 10*3/uL (ref 0.7–4.0)
MCH: 30.4 pg (ref 26.0–34.0)
MCHC: 32.6 g/dL (ref 30.0–36.0)
MCV: 93 fL (ref 78.0–100.0)
Monocytes Absolute: 0.4 10*3/uL (ref 0.1–1.0)
Monocytes Relative: 7 % (ref 3–12)
Neutro Abs: 2.9 10*3/uL (ref 1.7–7.7)
Neutrophils Relative %: 58 % (ref 43–77)
PLATELETS: 228 10*3/uL (ref 150–400)
RBC: 4.15 MIL/uL (ref 3.87–5.11)
RDW: 13.9 % (ref 11.5–15.5)
WBC: 5 10*3/uL (ref 4.0–10.5)

## 2014-02-05 LAB — URINALYSIS, ROUTINE W REFLEX MICROSCOPIC
Bilirubin Urine: NEGATIVE
Glucose, UA: NEGATIVE mg/dL
KETONES UR: NEGATIVE mg/dL
Nitrite: NEGATIVE
PH: 5.5 (ref 5.0–8.0)
Protein, ur: NEGATIVE mg/dL
Specific Gravity, Urine: 1.013 (ref 1.005–1.030)
Urobilinogen, UA: 0.2 mg/dL (ref 0.0–1.0)

## 2014-02-05 LAB — URINE MICROSCOPIC-ADD ON

## 2014-02-05 LAB — POC URINE PREG, ED: Preg Test, Ur: NEGATIVE

## 2014-02-05 LAB — ETHANOL: Alcohol, Ethyl (B): 200 mg/dL — ABNORMAL HIGH (ref 0–11)

## 2014-02-05 LAB — PROTIME-INR
INR: 1.03 (ref 0.00–1.49)
Prothrombin Time: 13.5 seconds (ref 11.6–15.2)

## 2014-02-05 MED ORDER — IOHEXOL 300 MG/ML  SOLN
100.0000 mL | Freq: Once | INTRAMUSCULAR | Status: AC | PRN
  Administered 2014-02-05: 100 mL via INTRAVENOUS

## 2014-02-05 MED ORDER — ONDANSETRON HCL 4 MG/2ML IJ SOLN
4.0000 mg | Freq: Once | INTRAMUSCULAR | Status: AC
  Administered 2014-02-05: 4 mg via INTRAVENOUS
  Filled 2014-02-05: qty 2

## 2014-02-05 MED ORDER — HYDROCODONE-ACETAMINOPHEN 5-325 MG PO TABS: 1.0000 | ORAL_TABLET | Freq: Four times a day (QID) | ORAL | Status: DC | PRN

## 2014-02-05 NOTE — Progress Notes (Signed)
Earrings removed from ears for scan. Earrings placed back in ears before patient left room.

## 2014-02-05 NOTE — ED Notes (Signed)
Pt in CT.
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
## 2014-02-05 NOTE — ED Notes (Signed)
PA at bedside.

## 2014-02-05 NOTE — ED Notes (Signed)
Ortho notified of need for brace
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
## 2014-02-05 NOTE — ED Notes (Signed)
Pt successfully ambulated to and from restroom. 
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
## 2014-02-05 NOTE — Discharge Instructions (Signed)
Please return immediately to the Emergency Room if you have loss of your bowel or bladder, you develop tingling or numbness around the genital area or you have uncontrollable pain.    Back, Compression Fracture A compression fracture happens when a force is put upon the length of your spine. Slipping and falling on your bottom are examples of such a force. When this happens, sometimes the force is great enough to compress the building blocks (vertebral bodies) of your spine. Although this causes a lot of pain, this can usually be treated at home, unless your caregiver feels hospitalization is needed for pain control. Your backbone (spinal column) is made up of 24 main vertebral bodies in addition to the sacrum and coccyx (see illustration). These are held together by tough fibrous tissues (ligaments) and by support of your muscles. Nerve roots pass through the openings between the vertebrae. A sudden wrenching move, injury, or a fall may cause a compression fracture of one of the vertebral bodies. This may result in back pain or spread of pain into the belly (abdomen), the buttocks, and down the leg into the foot. Pain may also be created by muscle spasm alone. Large studies have been undertaken to determine the best possible course of action to help your back following injury and also to prevent future problems. The recommendations are as follows. FOLLOWING A COMPRESSION FRACTURE: Do the following only if advised by your caregiver.   If a back brace has been suggested or provided, wear it as directed.  Do not stop wearing the back brace unless instructed by your caregiver.  When allowed to return to regular activities, avoid a sedentary lifestyle. Actively exercise. Sporadic weekend binges of tennis, racquetball, or waterskiing may actually aggravate or create problems, especially if you are not in condition for that activity.  Avoid sports requiring sudden body movements until you are in condition  for them. Swimming and walking are safer activities.  Maintain good posture.  Avoid obesity.  If not already done, you should have a DEXA scan. Based on the results, be treated for osteoporosis. FOLLOWING ACUTE (SUDDEN) INJURY:  Only take over-the-counter or prescription medicines for pain, discomfort, or fever as directed by your caregiver.  Use bed rest for only the most extreme acute episode. Prolonged bed rest may aggravate your condition. Ice used for acute conditions is effective. Use a large plastic bag filled with ice. Wrap it in a towel. This also provides excellent pain relief. This may be continuous. Or use it for 30 minutes every 2 hours during acute phase, then as needed. Heat for 30 minutes prior to activities is helpful.  As soon as the acute phase (the time when your back is too painful for you to do normal activities) is over, it is important to resume normal activities and work Arboriculturisthardening programs. Back injuries can cause potentially marked changes in lifestyle. So it is important to attack these problems aggressively.  See your caregiver for continued problems. He or she can help or refer you for appropriate exercises, physical therapy, and work hardening if needed.  If you are given narcotic medications for your condition, for the next 24 hours do not:  Drive.  Operate machinery or power tools.  Sign legal documents.  Do not drink alcohol, or take sleeping pills or other medications that may interfere with treatment. If your caregiver has given you a follow-up appointment, it is very important to keep that appointment. Not keeping the appointment could result in a chronic  or permanent injury, pain, and disability. If there is any problem keeping the appointment, you must call back to this facility for assistance.  SEEK IMMEDIATE MEDICAL CARE IF:  You develop numbness, tingling, weakness, or problems with the use of your arms or legs.  You develop severe back pain not  relieved with medications.  You have changes in bowel or bladder control.  You have increasing pain in any areas of the body. Document Released: 06/10/2005 Document Revised: 10/25/2013 Document Reviewed: 01/13/2008 [facility] Patient Information 2015 [place], [place]. This information is not intended to replace advice given to you by your health care provider. Make sure you discuss any questions you have with your health care provider.   Emergency Department Resource Guide 1) Find a Doctor and Pay Out of Pocket Although you won't have to find out who is covered by your insurance plan, it is a good idea to ask around and get recommendations. You will then need to call the office and see if the doctor you have chosen will accept you as a new patient and what types of options they offer for patients who are self-pay. Some doctors offer discounts or will set up payment plans for their patients who do not have insurance, but you will need to ask so you aren't surprised when you get to your appointment.  2) Contact Your Local Health Department Not all health departments have doctors that can see patients for sick visits, but many do, so it is worth a call to see if yours does. If you don't know where your local health department is, you can check in your phone book. The CDC also has a tool to help you locate your state's health department, and many state websites also have listings of all of their local health departments.  3) Find a Walk-in Clinic If your illness is not likely to be very severe or complicated, you may want to try a walk in clinic. These are popping up all over the country in pharmacies, drugstores, and shopping centers. They're usually staffed by nurse practitioners or physician assistants that have been trained to treat common illnesses and complaints. They're usually fairly quick and inexpensive. However, if you have serious medical issues or chronic medical problems, these are probably  not your best option.  No Primary Care Doctor: - Call Health Connect at  [phone] - they can help you locate a primary care doctor that  accepts your insurance, provides certain services, etc. - Physician Referral Service- [phone]  Chronic Pain Problems: Organization         Address  Phone   Notes  [facility]  [phone] Patients need to be referred by their primary care doctor.   Medication Assistance: Organization         Address  Phone   Notes  [facility] Medication [facility] [address] [place] [place], [place] [address] [phone] --Must be a resident of [facility] -- Must have NO insurance coverage whatsoever (no Medicaid/ Medicare, etc.) -- The pt. MUST have a primary care doctor that directs their care regularly and follows them in the community   [facility]  [phone]   [employer]  [phone]    Agencies that provide inexpensive medical care: Organization         Address  Phone   Notes  [facility]  [phone]   [facility]    [phone]   Women's  [facility] [address] [place], [place] [address] [phone]   [facility] [address] [place], [place] [phone]   Planned Parenthood    [phone]   [facility]    [phone]   [facility] and [facility]  [address], [place] Phone:  [phone], Fax:  [phone] Hours of Operation:  9 am - 6 pm, M-F.  Also accepts Medicaid/Medicare and self-pay.  [facility]  [address], [place] Phone: [phone], Fax: [phone]. Hours of Operation:  8:30 am - 5:30 pm, M-F.  Also accepts Medicaid and self-pay.  [facility] [place] [address], [place] Phone: [phone]   [facility] [address] Natasha Bence [place], [place] [phone], Ext. [phone] Mondays & Thursdays: 7-9 AM.   First 15 patients are seen on a first come, first serve basis.    Medicaid-accepting [facility] Providers:  Organization         Address  Phone   Notes  [facility] [address], [place] [phone] Also accepts self-pay patients.  [facility] [address] Laurell Josephs [place], [place]  [phone]   [facility] [address], [place] [phone]   [facility] [address] [place] [phone]   Renaye Rakers [address], [place]   [phone] Only accepts [place] Access [place] patients after they have their name applied to their card.   Self-Pay (no insurance) in [facility]:  Organization         Address  Phone   Notes  Sickle Cell Patients, [facility] [address] [place], [place] [phone]   [facility] [address] [place], [place] [phone]   [facility]   [address],  [phone]   [facility]/Dr. Osei-Bonsu  [address], [place] or [address], [place] [phone] Phone number for both [place] and [place] locations is the same.  [facility] [address] [place] [phone]   [facility] [address] [place] or [address] [phone]   [facility] [address], [place] [phone], phone; [phone], fax Sees patients 1st and 3rd Saturday of every month.  Must not qualify for public or private insurance (i.e. Medicaid, Medicare, [place] Health Choice, Veterans' Benefits)  Household income should be no more than 200% of the poverty level The clinic cannot treat you if you are pregnant or think you are pregnant  Sexually transmitted diseases are not treated at the clinic.    Dental  Care: Organization         Address  Phone  Notes  [facility] Department of [facility] [address] [place], [place] [phone] Accepts children up to age 23 who are enrolled in [place] or [place] Health Choice; pregnant women with a Medicaid card; and children who have applied for Medicaid or [place] Health Choice, but were declined, whose parents can pay a reduced fee at time of service.  [facility] Department of [facility]  [address], [place] [phone] Accepts children up to age 43 who are  enrolled in Medicaid or [place] Health Choice; pregnant women with a Medicaid card; and children who have applied for Medicaid or [place] Health Choice, but were declined, whose parents can pay a reduced fee at time of service.  [facility]  [address] [place], [place] [phone] Patients are seen by appointment only. Walk-ins are not accepted. [facility] will see patients 35 years of age and older. Monday - Tuesday (8am-5pm) Most Wednesdays (8:30-5pm) $30 per visit, cash only  [facility] Adult Dental Access PROGRAM  [address], [facility] [phone] Patients are seen by appointment only. Walk-ins are not accepted. [facility] will see patients 24 years of age and older. One Wednesday Evening (Monthly: Volunteer Based).  $30 per visit, cash only  [employer]  [phone] for adults; Children under age 78, call Graduate Pediatric Dentistry at [phone]. Children aged 2-14, please call [phone] to request a pediatric application.  Dental services are provided in all areas of dental care including fillings, crowns and bridges, complete and partial dentures, implants, gum treatment, root canals, and extractions. Preventive care is also provided. Treatment is provided to both adults and children. Patients are selected via a lottery and there is often a waiting list.   [facility] [address] [place]  [phone] [email or web address]   [facility] [address] [place], [place] [phone], Ext. [phone] Second and Fourth Thursday of each month, opens at 6:30 AM; Clinic ends at 9 AM.  Patients are seen on a first-come first-served basis, and a limited number are seen during each clinic.   [facility]  [address] Ether Griffins [place], [place] [phone]   Eligibility Requirements You must have lived in [place], [place], or [place] counties for at least the last three months.   You cannot be eligible for state or federal sponsored [place], including [employer], [place], or [employer].   You generally cannot be eligible for healthcare insurance through your employer.    How to apply: Eligibility screenings are held every Tuesday and Wednesday afternoon from 1:00 pm until 4:00 pm. You do not need an appointment for the interview!  [facility] [address], [place], [place] [phone]   [facility] Health Department  [phone]   [facility] Health Department  [phone]   [facility] Health Department  [phone]    Behavioral Health Resources in the Community: Intensive Outpatient Programs Organization         Address  Phone  Notes  [facility] Services [address] [place], [place] [phone]   [facility] Outpatient [address], [place], [place] [phone]   [facility] [address] [place], [place]  [phone]   [facility] Mental Health [address]  [place], [place] [phone] or [phone]   Substance Abuse Resources Organization         Address  Phone  Notes  Alcohol [facility] Drug Services  [phone]   [facility]  [phone]   [facility]  [phone]   Floydene Flock  [phone]   Residential & Outpatient Substance Abuse Program  [phone]     Psychological Services Organization         Address  Phone  Notes  [facility] Behavioral Health  [phone]   [facility] Services  [phone]   [facility] Mental Health [address],  [place] [phone] or [phone]    Mobile Crisis Teams Organization         Address  Phone  Notes  [facility], Mobile Crisis Care Unit  [phone]   [facility]  [address] [place], [place] [phone]   Doristine Locks [address] [place] [phone]    Self-Help/Support Groups Organization         Address  Phone             Notes  [facility] [place] - variety of support groups  336- I7437963 Call for more information  Narcotics Anonymous (NA), [facility] [address], [employer] [place]  2 meetings at this location   Statistician         Address  Phone  Notes  [facility] [address],    [place]  [phone]   [facility]  [address], [place] [address], [place], [place] [phone]   [facility] [address] [place], [place] [phone] Admissions: 8am-3pm M-F  [facility] [address]    [place], [place] [phone]   [facility] [address] [place], [place], [place] [phone]   [facility] [address],  [place], [place] [phone]   [facility] - Intensive Outpatient [address] Laurell Josephs [address], [place], [place] [phone]   [facility] ([facility] [address] [place].,  [place], [place] [phone] or [phone]   [facility] ([facility]) [address] [place], [place] [phone] Accepts Medicaid  [facility] [place] [address]  [place] [phone] Substance Abuse/Addiction Treatment   [facility] Organization         Address  Phone  Notes  [facility]  [phone]   Angie Fava, PhD [address] Ervin Knack [place], [place]   [phone] or [phone]   [facility] Behavioral   [address] [place], [place] [phone]   [facility] [address] [place], [place] [phone] Insurance/Medicaid/sponsorship through [facility] and Families [address]                                    [place], [place] [phone] Therapy/tele-psych/case  [facility] [address], [place] [phone]    Dr. Lolly Mustache  [phone]   [facility] [place]  [employer] [facility] Dept. 1) [address], [place] 2) [address], [place] 3)  [address], [place] [phone]  [phone]   [facility] Child Abuse Hotline 331-437-6570 or [phone] (After Hours)

## 2014-02-05 NOTE — ED Provider Notes (Signed)
Medical screening examination/treatment/procedure(s) were performed by non-physician practitioner and as supervising physician I was immediately available for consultation/collaboration.   EKG Interpretation None       Olivia Mackielga M Alin Chavira, MD 02/05/14 2130
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
## 2014-07-14 ENCOUNTER — Ambulatory Visit
Admission: RE | Admit: 2014-07-14 | Discharge: 2014-07-14 | Disposition: A | Discharge status: Home / Self Care | Payer: No Typology Code available for payment source | Source: Ambulatory Visit | Attending: Internal Medicine | Admitting: Internal Medicine

## 2014-07-14 ENCOUNTER — Other Ambulatory Visit: Payer: Self-pay | Admitting: Internal Medicine

## 2014-07-14 DIAGNOSIS — M542 Cervicalgia: Principal | ICD-10-CM

## 2014-07-14 DIAGNOSIS — G8929 Other chronic pain: Secondary | ICD-10-CM

## 2015-04-19 ENCOUNTER — Other Ambulatory Visit (HOSPITAL_COMMUNITY): Payer: Self-pay | Admitting: Internal Medicine

## 2015-04-19 DIAGNOSIS — T7411XA Adult physical abuse, confirmed, initial encounter: Secondary | ICD-10-CM

## 2015-04-20 ENCOUNTER — Ambulatory Visit (HOSPITAL_COMMUNITY)
Admission: RE | Admit: 2015-04-20 | Discharge: 2015-04-20 | Disposition: A | Discharge status: Home / Self Care | Payer: No Typology Code available for payment source | Source: Ambulatory Visit | Attending: Internal Medicine | Admitting: Internal Medicine

## 2015-04-20 DIAGNOSIS — T7411XA Adult physical abuse, confirmed, initial encounter: Secondary | ICD-10-CM | POA: Insufficient documentation

## 2015-05-06 ENCOUNTER — Emergency Department (HOSPITAL_COMMUNITY): Payer: No Typology Code available for payment source

## 2015-05-06 ENCOUNTER — Emergency Department (HOSPITAL_COMMUNITY)
Admission: EM | Admit: 2015-05-06 | Discharge: 2015-05-06 | Disposition: A | Discharge status: Home / Self Care | Payer: No Typology Code available for payment source | Attending: Emergency Medicine | Admitting: Emergency Medicine

## 2015-05-06 ENCOUNTER — Encounter (HOSPITAL_COMMUNITY): Payer: Self-pay | Admitting: *Deleted

## 2015-05-06 DIAGNOSIS — S199XXA Unspecified injury of neck, initial encounter: Secondary | ICD-10-CM | POA: Diagnosis present

## 2015-05-06 DIAGNOSIS — Z23 Encounter for immunization: Secondary | ICD-10-CM | POA: Insufficient documentation

## 2015-05-06 DIAGNOSIS — I10 Essential (primary) hypertension: Secondary | ICD-10-CM | POA: Insufficient documentation

## 2015-05-06 DIAGNOSIS — S3992XA Unspecified injury of lower back, initial encounter: Secondary | ICD-10-CM | POA: Insufficient documentation

## 2015-05-06 DIAGNOSIS — I2510 Atherosclerotic heart disease of native coronary artery without angina pectoris: Secondary | ICD-10-CM | POA: Diagnosis not present

## 2015-05-06 DIAGNOSIS — Y9389 Activity, other specified: Secondary | ICD-10-CM | POA: Insufficient documentation

## 2015-05-06 DIAGNOSIS — Z79899 Other long term (current) drug therapy: Secondary | ICD-10-CM | POA: Insufficient documentation

## 2015-05-06 DIAGNOSIS — Y92410 Unspecified street and highway as the place of occurrence of the external cause: Secondary | ICD-10-CM | POA: Insufficient documentation

## 2015-05-06 DIAGNOSIS — S20319A Abrasion of unspecified front wall of thorax, initial encounter: Secondary | ICD-10-CM | POA: Insufficient documentation

## 2015-05-06 DIAGNOSIS — S1081XA Abrasion of other specified part of neck, initial encounter: Secondary | ICD-10-CM | POA: Insufficient documentation

## 2015-05-06 DIAGNOSIS — S0990XA Unspecified injury of head, initial encounter: Secondary | ICD-10-CM | POA: Diagnosis not present

## 2015-05-06 DIAGNOSIS — M545 Low back pain: Secondary | ICD-10-CM

## 2015-05-06 DIAGNOSIS — Z7982 Long term (current) use of aspirin: Secondary | ICD-10-CM | POA: Insufficient documentation

## 2015-05-06 DIAGNOSIS — M542 Cervicalgia: Secondary | ICD-10-CM

## 2015-05-06 DIAGNOSIS — Y998 Other external cause status: Secondary | ICD-10-CM | POA: Insufficient documentation

## 2015-05-06 LAB — URINALYSIS, ROUTINE W REFLEX MICROSCOPIC
BILIRUBIN URINE: NEGATIVE
GLUCOSE, UA: NEGATIVE mg/dL
HGB URINE DIPSTICK: NEGATIVE
Ketones, ur: NEGATIVE mg/dL
Nitrite: NEGATIVE
Protein, ur: NEGATIVE mg/dL
SPECIFIC GRAVITY, URINE: 1.007 (ref 1.005–1.030)
UROBILINOGEN UA: 1 mg/dL (ref 0.0–1.0)
pH: 7 (ref 5.0–8.0)

## 2015-05-06 LAB — CBC
HEMATOCRIT: 41.5 % (ref 36.0–46.0)
HEMOGLOBIN: 14.1 g/dL (ref 12.0–15.0)
MCH: 30.7 pg (ref 26.0–34.0)
MCHC: 34 g/dL (ref 30.0–36.0)
MCV: 90.4 fL (ref 78.0–100.0)
Platelets: 232 10*3/uL (ref 150–400)
RBC: 4.59 MIL/uL (ref 3.87–5.11)
RDW: 12.9 % (ref 11.5–15.5)
WBC: 4.4 10*3/uL (ref 4.0–10.5)

## 2015-05-06 LAB — I-STAT TROPONIN, ED: TROPONIN I, POC: 0 ng/mL (ref 0.00–0.08)

## 2015-05-06 LAB — URINE MICROSCOPIC-ADD ON

## 2015-05-06 LAB — BASIC METABOLIC PANEL
ANION GAP: 10 (ref 5–15)
BUN: 11 mg/dL (ref 6–20)
CHLORIDE: 102 mmol/L (ref 101–111)
CO2: 25 mmol/L (ref 22–32)
Calcium: 8.9 mg/dL (ref 8.9–10.3)
Creatinine, Ser: 0.92 mg/dL (ref 0.44–1.00)
GFR calc non Af Amer: >60 mL/min (ref >60)
Glucose, Bld: 70 mg/dL (ref 65–99)
POTASSIUM: 3.9 mmol/L (ref 3.5–5.1)
SODIUM: 137 mmol/L (ref 135–145)

## 2015-05-06 MED ORDER — NAPROXEN 250 MG PO TABS: 250.0000 mg | ORAL_TABLET | Freq: Two times a day (BID) | ORAL | Status: DC

## 2015-05-06 MED ORDER — TETANUS-DIPHTH-ACELL PERTUSSIS 5-2.5-18.5 LF-MCG/0.5 IM SUSP
0.5000 mL | Freq: Once | INTRAMUSCULAR | Status: AC
  Administered 2015-05-06: 0.5 mL via INTRAMUSCULAR
  Filled 2015-05-06: qty 0.5

## 2015-05-06 MED ORDER — METHOCARBAMOL 500 MG PO TABS: 500.0000 mg | ORAL_TABLET | Freq: Two times a day (BID) | ORAL | Status: DC | PRN
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
## 2015-05-06 NOTE — ED Notes (Signed)
Pt requesting pain meds, visitor stated that "hydrocodone makes her sick, but she can take oxycodone. Can she have some of that?" pt's pupils dilated, slight slurred speech, subconjunctival hematoma in right eye.

## 2015-05-06 NOTE — ED Notes (Signed)
Acuity level changed to 3,
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
## 2016-08-08 ENCOUNTER — Other Ambulatory Visit (HOSPITAL_COMMUNITY): Payer: Self-pay | Admitting: Internal Medicine

## 2016-08-08 ENCOUNTER — Ambulatory Visit (HOSPITAL_COMMUNITY)
Admission: RE | Admit: 2016-08-08 | Discharge: 2016-08-08 | Disposition: A | Discharge status: Home / Self Care | Payer: Self-pay | Source: Ambulatory Visit | Attending: Internal Medicine | Admitting: Internal Medicine

## 2016-08-08 DIAGNOSIS — X58XXXA Exposure to other specified factors, initial encounter: Secondary | ICD-10-CM | POA: Insufficient documentation

## 2016-08-08 DIAGNOSIS — S92911A Unspecified fracture of right toe(s), initial encounter for closed fracture: Secondary | ICD-10-CM

## 2016-08-08 DIAGNOSIS — S92514A Nondisplaced fracture of proximal phalanx of right lesser toe(s), initial encounter for closed fracture: Secondary | ICD-10-CM | POA: Insufficient documentation

## 2018-08-05 ENCOUNTER — Telehealth (HOSPITAL_COMMUNITY): Payer: Self-pay

## 2018-08-05 NOTE — Telephone Encounter (Signed)
Left message about mammo scholarship application. Left name and number for the patient to call back.

## 2018-08-07 ENCOUNTER — Other Ambulatory Visit: Payer: Self-pay | Admitting: Obstetrics and Gynecology

## 2018-08-07 ENCOUNTER — Other Ambulatory Visit: Payer: Self-pay | Admitting: Family Medicine

## 2018-08-07 DIAGNOSIS — Z1231 Encounter for screening mammogram for malignant neoplasm of breast: Secondary | ICD-10-CM

## 2019-01-11 ENCOUNTER — Telehealth: Payer: Self-pay

## 2019-01-11 NOTE — Telephone Encounter (Signed)
Spoke with patient about missed mammo scholarship appointment. Informed patient that she could call the breast center and reschedule directly with them. Patient voiced understanding.

## 2019-01-19 ENCOUNTER — Other Ambulatory Visit: Payer: Self-pay | Admitting: *Deleted

## 2019-01-19 DIAGNOSIS — Z20822 Contact with and (suspected) exposure to covid-19: Secondary | ICD-10-CM

## 2019-01-20 ENCOUNTER — Other Ambulatory Visit: Payer: Self-pay

## 2019-01-20 DIAGNOSIS — Z20822 Contact with and (suspected) exposure to covid-19: Secondary | ICD-10-CM

## 2019-01-22 LAB — NOVEL CORONAVIRUS, NAA: SARS-CoV-2, NAA: NOT DETECTED

## 2019-07-07 ENCOUNTER — Emergency Department (HOSPITAL_COMMUNITY): Payer: Self-pay

## 2019-07-07 ENCOUNTER — Emergency Department (HOSPITAL_COMMUNITY)
Admission: EM | Admit: 2019-07-07 | Discharge: 2019-07-07 | Disposition: A | Discharge status: Home / Self Care | Payer: Self-pay | Attending: Emergency Medicine | Admitting: Emergency Medicine

## 2019-07-07 ENCOUNTER — Other Ambulatory Visit: Payer: Self-pay

## 2019-07-07 ENCOUNTER — Encounter (HOSPITAL_COMMUNITY): Payer: Self-pay | Admitting: Emergency Medicine

## 2019-07-07 DIAGNOSIS — S42452A Displaced fracture of lateral condyle of left humerus, initial encounter for closed fracture: Secondary | ICD-10-CM

## 2019-07-07 DIAGNOSIS — I259 Chronic ischemic heart disease, unspecified: Secondary | ICD-10-CM | POA: Insufficient documentation

## 2019-07-07 DIAGNOSIS — Y999 Unspecified external cause status: Secondary | ICD-10-CM | POA: Insufficient documentation

## 2019-07-07 DIAGNOSIS — I10 Essential (primary) hypertension: Secondary | ICD-10-CM | POA: Insufficient documentation

## 2019-07-07 DIAGNOSIS — S42492A Other displaced fracture of lower end of left humerus, initial encounter for closed fracture: Secondary | ICD-10-CM | POA: Insufficient documentation

## 2019-07-07 DIAGNOSIS — S42465A Nondisplaced fracture of medial condyle of left humerus, initial encounter for closed fracture: Secondary | ICD-10-CM | POA: Insufficient documentation

## 2019-07-07 DIAGNOSIS — Z79899 Other long term (current) drug therapy: Secondary | ICD-10-CM | POA: Insufficient documentation

## 2019-07-07 DIAGNOSIS — S52045A Nondisplaced fracture of coronoid process of left ulna, initial encounter for closed fracture: Secondary | ICD-10-CM | POA: Insufficient documentation

## 2019-07-07 DIAGNOSIS — Y9389 Activity, other specified: Secondary | ICD-10-CM | POA: Insufficient documentation

## 2019-07-07 DIAGNOSIS — Y92007 Garden or yard of unspecified non-institutional (private) residence as the place of occurrence of the external cause: Secondary | ICD-10-CM | POA: Insufficient documentation

## 2019-07-07 DIAGNOSIS — W19XXXA Unspecified fall, initial encounter: Secondary | ICD-10-CM

## 2019-07-07 DIAGNOSIS — W01198A Fall on same level from slipping, tripping and stumbling with subsequent striking against other object, initial encounter: Secondary | ICD-10-CM | POA: Insufficient documentation

## 2019-07-07 DIAGNOSIS — Z7982 Long term (current) use of aspirin: Secondary | ICD-10-CM | POA: Insufficient documentation

## 2019-07-07 DIAGNOSIS — S42462A Displaced fracture of medial condyle of left humerus, initial encounter for closed fracture: Secondary | ICD-10-CM

## 2019-07-07 LAB — CBG MONITORING, ED: Glucose-Capillary: 79 mg/dL (ref 70–99)

## 2019-07-07 MED ORDER — OXYCODONE-ACETAMINOPHEN 5-325 MG PO TABS
1.0000 | ORAL_TABLET | Freq: Once | ORAL | Status: AC
  Administered 2019-07-07: 1 via ORAL
  Filled 2019-07-07: qty 1

## 2019-07-07 MED ORDER — OXYCODONE-ACETAMINOPHEN 5-325 MG PO TABS
1.0000 | ORAL_TABLET | Freq: Once | ORAL | Status: AC
  Administered 2019-07-07: 14:00:00 1 via ORAL
  Filled 2019-07-07: qty 1

## 2019-07-07 MED ORDER — OXYCODONE-ACETAMINOPHEN 5-325 MG PO TABS: 1.0000 | ORAL_TABLET | Freq: Four times a day (QID) | ORAL | 0 refills | Status: DC | PRN

## 2019-07-07 MED ORDER — ONDANSETRON 4 MG PO TBDP
4.0000 mg | ORAL_TABLET | Freq: Once | ORAL | Status: AC
  Administered 2019-07-07: 4 mg via ORAL
  Filled 2019-07-07: qty 1

## 2019-07-07 MED ORDER — IBUPROFEN 400 MG PO TABS
400.0000 mg | ORAL_TABLET | Freq: Once | ORAL | Status: AC | PRN
  Administered 2019-07-07: 400 mg via ORAL

## 2019-07-07 NOTE — ED Triage Notes (Signed)
Pt reports slipping on the deck today and landed on left arm. Endorses elbow pain and unable to move arm.
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
## 2019-07-07 NOTE — ED Notes (Signed)
Pain meds given per MAR. Name/DOB verified with pt. All discharge paperwork verbally explained to pt. Pt verbalizes understanding. PL arm in splint and sling. Pt in NAD, VSS. Alert, speaking in full sentences. Breathing easy, non-labored. Wheeled from ED by tech with all belongings.

## 2019-07-07 NOTE — ED Notes (Signed)
Ortho tech at bedside to splint arm

## 2019-07-07 NOTE — ED Notes (Signed)
Left arm in sling upon arrival to ED room. Ice applied.

## 2019-07-07 NOTE — ED Notes (Signed)
Put patient on the monitor patient is resting with call bell in reach 

## 2019-07-07 NOTE — ED Notes (Signed)
Pt brought by [facility] to triage in stretcher. Xray reports the pt passed out for about 40 seconds and her left arm twitched.

## 2019-07-07 NOTE — ED Notes (Signed)
Pt transported to xray 

## 2019-07-07 NOTE — Discharge Instructions (Signed)
Please read instructions below. Keep the splint clean, dry and in place at all times. You can take oxycodone every 6 hours as needed for severe pain pain.  Be aware there is Tylenol this medication, do not take Tylenol with it.  However, it is safe to take ibuprofen/Advil with this medication every 6 hours for mild to moderate pain and swelling. Call the orthopedic specialist office the next business day to schedule an appointment for repeat xray and follow up on your injury. Return to the ED for concerning symptoms.

## 2019-09-01 ENCOUNTER — Emergency Department (HOSPITAL_COMMUNITY)
Admission: EM | Admit: 2019-09-01 | Discharge: 2019-09-01 | Disposition: A | Discharge status: Home / Self Care | Payer: Self-pay | Attending: Emergency Medicine | Admitting: Emergency Medicine

## 2019-09-01 ENCOUNTER — Other Ambulatory Visit: Payer: Self-pay

## 2019-09-01 ENCOUNTER — Encounter (HOSPITAL_COMMUNITY): Payer: Self-pay | Admitting: Emergency Medicine

## 2019-09-01 DIAGNOSIS — I2510 Atherosclerotic heart disease of native coronary artery without angina pectoris: Secondary | ICD-10-CM | POA: Insufficient documentation

## 2019-09-01 DIAGNOSIS — Z4689 Encounter for fitting and adjustment of other specified devices: Secondary | ICD-10-CM

## 2019-09-01 DIAGNOSIS — I10 Essential (primary) hypertension: Secondary | ICD-10-CM | POA: Insufficient documentation

## 2019-09-01 DIAGNOSIS — Z79899 Other long term (current) drug therapy: Secondary | ICD-10-CM | POA: Insufficient documentation

## 2019-09-01 NOTE — ED Triage Notes (Signed)
Pt here to have a cast removed that has been on for about 8 weeks , pt states that she was unable to go to her follow up

## 2019-09-01 NOTE — Discharge Instructions (Addendum)
We have remove your splint.  Use instruction below to help with rehab.  Follow-up with orthopedist for further care.  Wear sling as needed for support.
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
## 2020-04-14 IMAGING — CT CT ELBOW*L* W/O CM
1 of 4 series · 12 of 20 positions shown, 15 images · non-contrast
Comparison: Left elbow x-rays from same day.

CLINICAL DATA: Left elbow pain after fall.

EXAM:
CT OF THE UPPER LEFT EXTREMITY WITHOUT CONTRAST
TECHNIQUE: Multidetector CT imaging of the left elbow was performed according
to the standard protocol.

[Series 4: 2 1.5 mm st ax · axial · 0.29mm/px · z∈[-452,-299]mm · 12 of 122 slices shown, 15 images]
[im 10/122  soft-tissue]
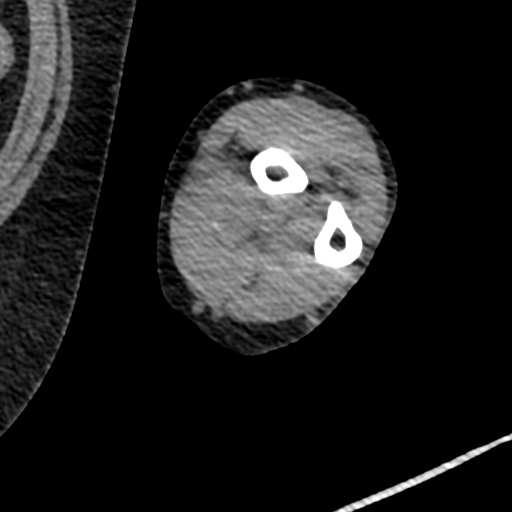
[im 10/122  bone]
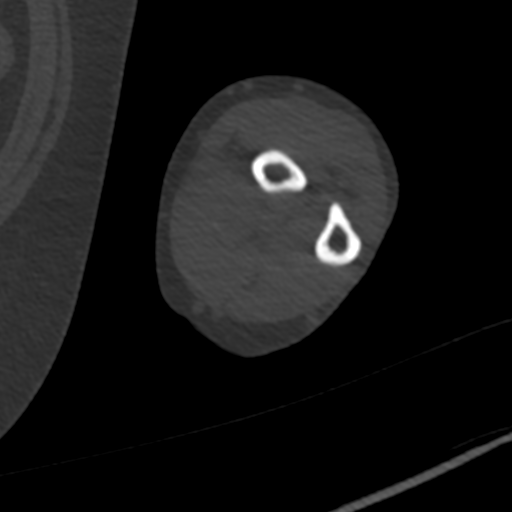
[im 19/122  bone]
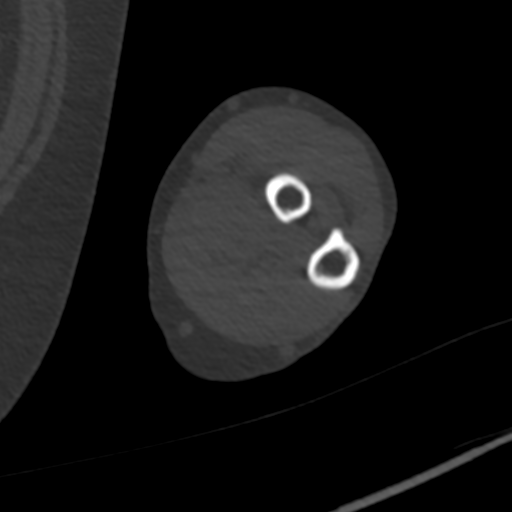
[im 28/122  bone]
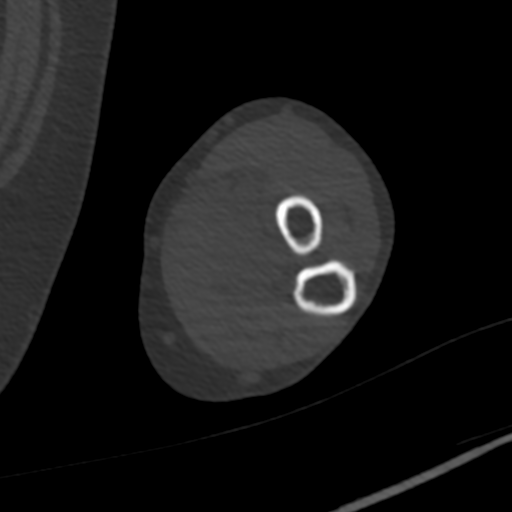
[im 38/122  bone]
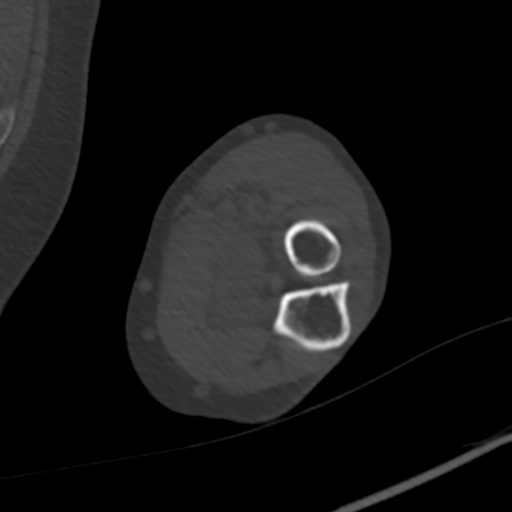
[im 47/122  soft-tissue]
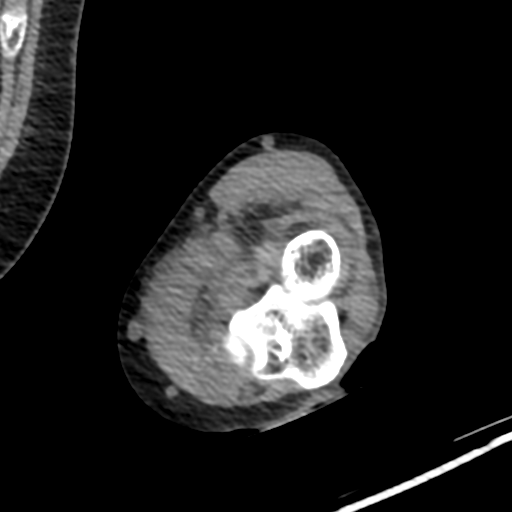
[im 47/122  bone]
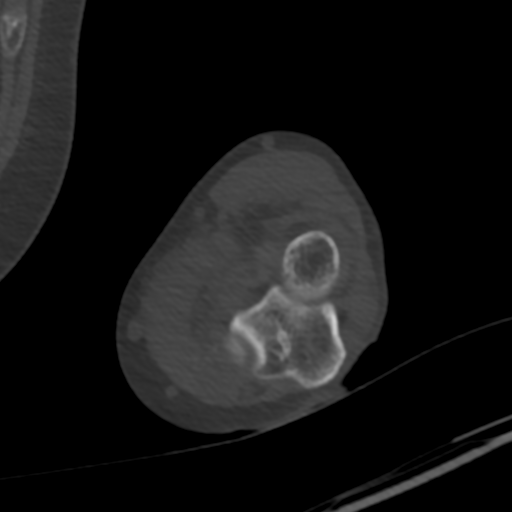
[im 56/122  bone]
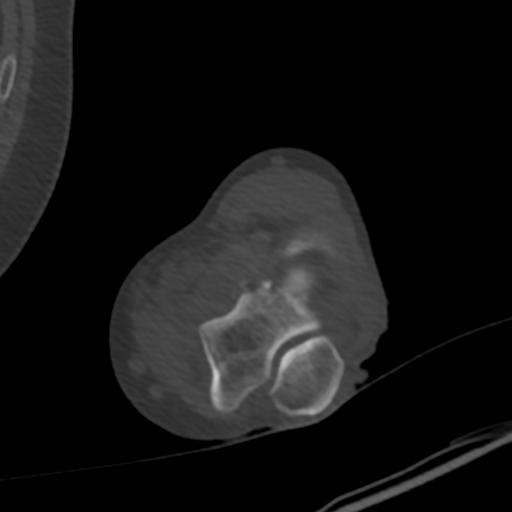
[im 66/122  bone]
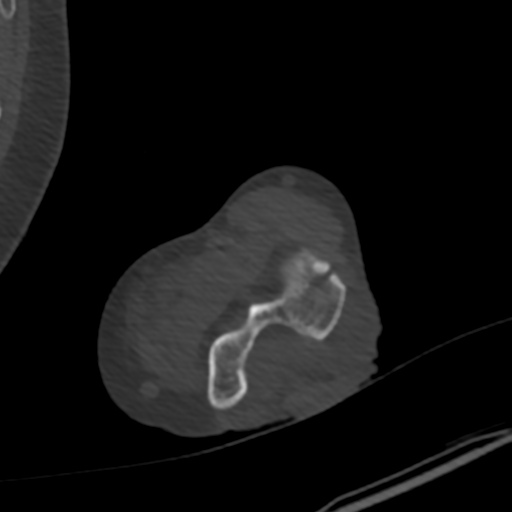
[im 75/122  bone]
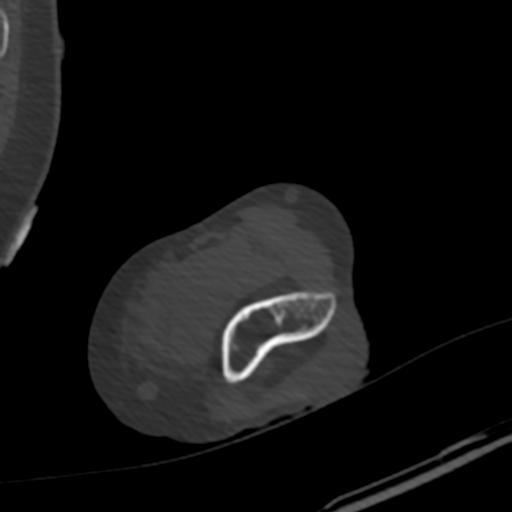
[im 84/122  soft-tissue]
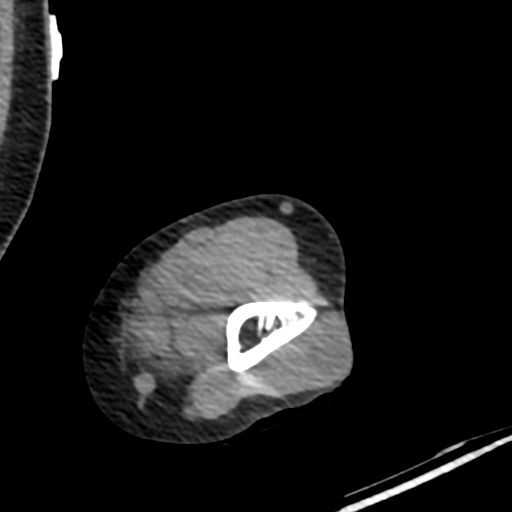
[im 84/122  bone]
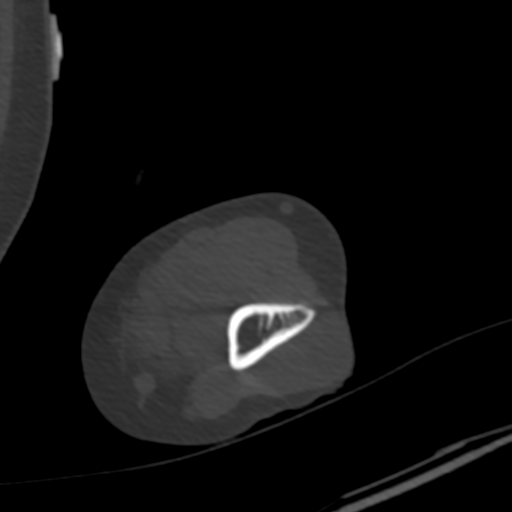
[im 94/122  bone]
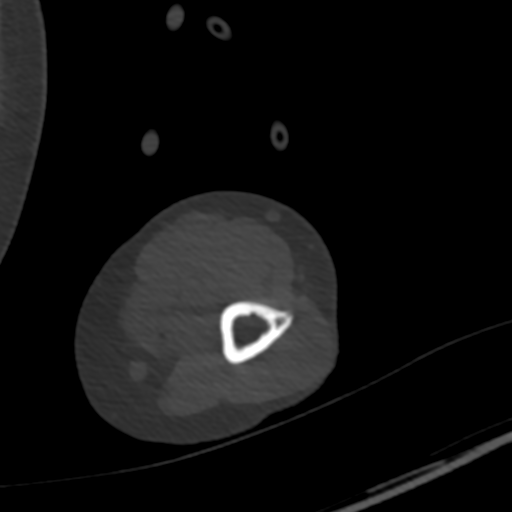
[im 103/122  bone]
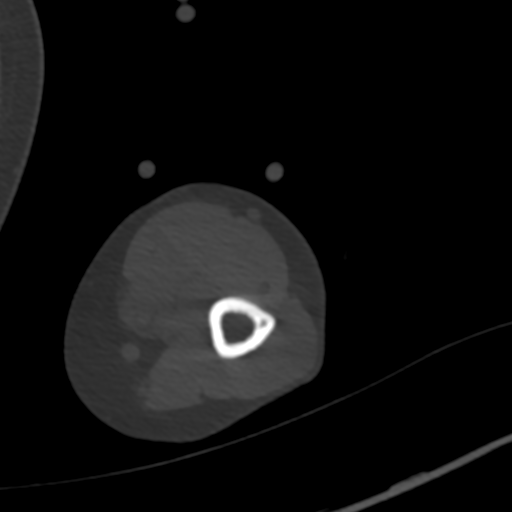
[im 112/122  bone]
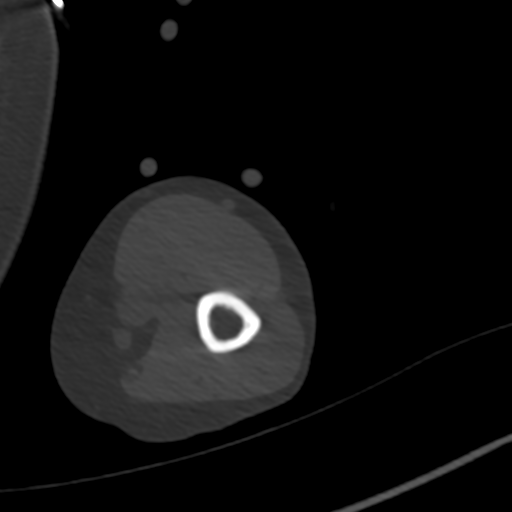

[12 of 20 positions shown; findings below may reference images not displayed]

FINDINGS: Bones/Joint/Cartilage

Acute comminuted and slightly impacted fracture of the capitellum.
Articular surface offset measures up to 2 mm. Tiny acute
nondisplaced fracture of the coronoid process (series 7, image 48).
Tiny avulsion fracture of the peripheral trochlea (series 7, image
55).

Joint spaces are preserved. Well corticated ossific density at the
tip of the coronoid process may be degenerative. Large joint
effusion.

Ligaments

Ligaments are suboptimally evaluated by CT.

Muscles and Tendons
Grossly intact.

Soft tissue
No fluid collection or hematoma.  No soft tissue mass.
IMPRESSION: 1. Acute comminuted slightly impacted fracture of the capitellum.
2. Tiny acute nondisplaced fracture of the coronoid process.
3. Tiny avulsion fracture of the peripheral trochlea.
4. Large joint effusion.

## 2021-04-17 ENCOUNTER — Emergency Department (HOSPITAL_COMMUNITY): Payer: Self-pay

## 2021-04-17 ENCOUNTER — Encounter (HOSPITAL_COMMUNITY): Payer: Self-pay | Admitting: Emergency Medicine

## 2021-04-17 ENCOUNTER — Inpatient Hospital Stay (HOSPITAL_COMMUNITY)
Admission: EM | Admit: 2021-04-17 | Discharge: 2021-04-19 | DRG: 379 | Disposition: A | Discharge status: Home / Self Care | Payer: Self-pay | Attending: Family Medicine | Admitting: Family Medicine

## 2021-04-17 DIAGNOSIS — I10 Essential (primary) hypertension: Secondary | ICD-10-CM | POA: Diagnosis present

## 2021-04-17 DIAGNOSIS — Z952 Presence of prosthetic heart valve: Secondary | ICD-10-CM

## 2021-04-17 DIAGNOSIS — E785 Hyperlipidemia, unspecified: Secondary | ICD-10-CM | POA: Diagnosis present

## 2021-04-17 DIAGNOSIS — K219 Gastro-esophageal reflux disease without esophagitis: Secondary | ICD-10-CM | POA: Diagnosis present

## 2021-04-17 DIAGNOSIS — K449 Diaphragmatic hernia without obstruction or gangrene: Secondary | ICD-10-CM | POA: Diagnosis present

## 2021-04-17 DIAGNOSIS — D649 Anemia, unspecified: Principal | ICD-10-CM

## 2021-04-17 DIAGNOSIS — Z79899 Other long term (current) drug therapy: Secondary | ICD-10-CM

## 2021-04-17 DIAGNOSIS — Z7982 Long term (current) use of aspirin: Secondary | ICD-10-CM

## 2021-04-17 DIAGNOSIS — K922 Gastrointestinal hemorrhage, unspecified: Principal | ICD-10-CM | POA: Diagnosis present

## 2021-04-17 DIAGNOSIS — F419 Anxiety disorder, unspecified: Secondary | ICD-10-CM | POA: Diagnosis present

## 2021-04-17 DIAGNOSIS — Z20822 Contact with and (suspected) exposure to covid-19: Secondary | ICD-10-CM | POA: Diagnosis present

## 2021-04-17 DIAGNOSIS — D509 Iron deficiency anemia, unspecified: Secondary | ICD-10-CM | POA: Diagnosis present

## 2021-04-17 DIAGNOSIS — F32A Depression, unspecified: Secondary | ICD-10-CM | POA: Diagnosis present

## 2021-04-17 DIAGNOSIS — I2510 Atherosclerotic heart disease of native coronary artery without angina pectoris: Secondary | ICD-10-CM | POA: Diagnosis present

## 2021-04-17 LAB — CBC WITH DIFFERENTIAL/PLATELET
Abs Immature Granulocytes: 0.03 10*3/uL (ref 0.00–0.07)
Basophils Absolute: 0.1 10*3/uL (ref 0.0–0.1)
Basophils Relative: 1 %
Eosinophils Absolute: 0.4 10*3/uL (ref 0.0–0.5)
Eosinophils Relative: 6 %
HCT: 21.3 % — ABNORMAL LOW (ref 36.0–46.0)
Hemoglobin: 5.7 g/dL — CL (ref 12.0–15.0)
Immature Granulocytes: 0 %
Lymphocytes Relative: 30 %
Lymphs Abs: 2 10*3/uL (ref 0.7–4.0)
MCH: 17.2 pg — ABNORMAL LOW (ref 26.0–34.0)
MCHC: 26.8 g/dL — ABNORMAL LOW (ref 30.0–36.0)
MCV: 64.4 fL — ABNORMAL LOW (ref 80.0–100.0)
Monocytes Absolute: 0.7 10*3/uL (ref 0.1–1.0)
Monocytes Relative: 10 %
Neutro Abs: 3.6 10*3/uL (ref 1.7–7.7)
Neutrophils Relative %: 53 %
Platelets: 560 10*3/uL — ABNORMAL HIGH (ref 150–400)
RBC: 3.31 MIL/uL — ABNORMAL LOW (ref 3.87–5.11)
RDW: 19.6 % — ABNORMAL HIGH (ref 11.5–15.5)
WBC: 6.7 10*3/uL (ref 4.0–10.5)
nRBC: 0.4 % — ABNORMAL HIGH (ref 0.0–0.2)

## 2021-04-17 LAB — FERRITIN: Ferritin: 3 ng/mL — ABNORMAL LOW (ref 11–307)

## 2021-04-17 LAB — PREPARE RBC (CROSSMATCH)

## 2021-04-17 LAB — TROPONIN I (HIGH SENSITIVITY)
Troponin I (High Sensitivity): 14 ng/L (ref <18)
Troponin I (High Sensitivity): 13 ng/L (ref <18)

## 2021-04-17 LAB — IRON AND TIBC
Iron: 12 ug/dL — ABNORMAL LOW (ref 28–170)
Saturation Ratios: 2 % — ABNORMAL LOW (ref 10.4–31.8)
TIBC: 668 ug/dL — ABNORMAL HIGH (ref 250–450)
UIBC: 656 ug/dL

## 2021-04-17 LAB — RETICULOCYTES
Immature Retic Fract: 26.2 % — ABNORMAL HIGH (ref 2.3–15.9)
RBC.: 2.94 MIL/uL — ABNORMAL LOW (ref 3.87–5.11)
Retic Count, Absolute: 57 10*3/uL (ref 19.0–186.0)
Retic Ct Pct: 1.9 % (ref 0.4–3.1)

## 2021-04-17 LAB — RESP PANEL BY RT-PCR (FLU A&B, COVID) ARPGX2
Influenza A by PCR: NEGATIVE
Influenza B by PCR: NEGATIVE
SARS Coronavirus 2 by RT PCR: NEGATIVE

## 2021-04-17 LAB — BASIC METABOLIC PANEL
Anion gap: 11 (ref 5–15)
BUN: 8 mg/dL (ref 6–20)
CO2: 22 mmol/L (ref 22–32)
Calcium: 9 mg/dL (ref 8.9–10.3)
Chloride: 103 mmol/L (ref 98–111)
Creatinine, Ser: 1.3 mg/dL — ABNORMAL HIGH (ref 0.44–1.00)
GFR, Estimated: 49 mL/min — ABNORMAL LOW (ref >60)
Glucose, Bld: 107 mg/dL — ABNORMAL HIGH (ref 70–99)
Potassium: 3.7 mmol/L (ref 3.5–5.1)
Sodium: 136 mmol/L (ref 135–145)

## 2021-04-17 LAB — FOLATE: Folate: 17.7 ng/mL (ref >5.9)

## 2021-04-17 LAB — VITAMIN B12: Vitamin B-12: 212 pg/mL (ref 180–914)

## 2021-04-17 LAB — POC OCCULT BLOOD, ED: Fecal Occult Bld: NEGATIVE

## 2021-04-17 MED ORDER — PANTOPRAZOLE SODIUM 40 MG IV SOLR: 40.0000 mg | Freq: Once | INTRAVENOUS | Status: DC

## 2021-04-17 MED ORDER — SODIUM CHLORIDE 0.9 % IV BOLUS: 1000.0000 mL | Freq: Once | INTRAVENOUS | Status: DC

## 2021-04-17 MED ORDER — PEG-KCL-NACL-NASULF-NA ASC-C 100 G PO SOLR
0.5000 | Freq: Once | ORAL | Status: AC
  Administered 2021-04-17: 100 g via ORAL
  Filled 2021-04-17: qty 1

## 2021-04-17 MED ORDER — PANTOPRAZOLE SODIUM 40 MG IV SOLR
40.0000 mg | Freq: Once | INTRAVENOUS | Status: AC
  Administered 2021-04-17: 40 mg via INTRAVENOUS
  Filled 2021-04-17: qty 40

## 2021-04-17 MED ORDER — FERROUS SULFATE 325 (65 FE) MG PO TABS
325.0000 mg | ORAL_TABLET | Freq: Every day | ORAL | Status: DC
  Administered 2021-04-18 – 2021-04-19 (×2): 325 mg via ORAL
  Filled 2021-04-17 (×2): qty 1

## 2021-04-17 MED ORDER — PEG-KCL-NACL-NASULF-NA ASC-C 100 G PO SOLR: 1.0000 | Freq: Once | ORAL | Status: DC

## 2021-04-17 MED ORDER — SODIUM CHLORIDE 0.9 % IV SOLN
500.0000 mg | Freq: Once | INTRAVENOUS | Status: AC
  Administered 2021-04-18: 500 mg via INTRAVENOUS
  Filled 2021-04-17 (×2): qty 25

## 2021-04-17 MED ORDER — SODIUM CHLORIDE 0.9 % IV SOLN: 10.0000 mL/h | Freq: Once | INTRAVENOUS | Status: DC

## 2021-04-17 MED ORDER — PANTOPRAZOLE SODIUM 40 MG IV SOLR
40.0000 mg | Freq: Two times a day (BID) | INTRAVENOUS | Status: DC
  Administered 2021-04-17 – 2021-04-19 (×4): 40 mg via INTRAVENOUS
  Filled 2021-04-17 (×4): qty 40

## 2021-04-17 NOTE — H&P (View-Only) (Signed)
   Consultation  Referring Provider: Dr. Knapp    Primary Care Physician:  Dean, Eric L, MD Primary Gastroenterologist: Unassigned       Reason for Consultation: Dark stool, anemia            HPI:   Joan [name] is a 54 y.o. female with a past medical history of CAD and hypertension who presented to the ER today with shortness of breath over the past month.  Apparently this was worsened by walking around and alleviated by resting.  In the ER patient found to have a hemoglobin of 5.7 and reported some black stools.  We were called in regards to this anemia and dark stools.    Today, patient reports that over the past month she has had increasing fatigue and dyspnea on exertion.  She went to see her PCP today who told her to go ahead and get to the ER.  Explains that other than the shortness of breath and some fatigue with some occasional "palpitations", she had been doing okay other than running out of her medications including Omeprazole 40 mg which she is on once daily for reflux over the past 3 to 4 weeks.  Tells me that in its place she has been using Pepto-Bismol daily, often more than once a day to control her symptoms.  Along with this has also noticed black/dark stools over the past 3 to 4 weeks.  Denies abdominal pain or change in bowel habits.  Normally has 1 bowel movement every other day with minimal discomfort in between.    Describes history of what sounds like an aortic aneurysm repair back in 2010.  She stays on a Aspirin 325 mg daily over the past few years.  Denies other NSAID use.  Does report a beer every other night or so.    Denies fever, chills, weight loss, nausea or vomiting.  ER course: Chest x-ray with no acute intrathoracic process, mild cardiomegaly and hiatal hernia; hemoglobin 5.7 (last known hemoglobin 14.1 on 05/06/2015), fecal occult negative  GI history: Cologuard through her PCP, she has done this once and it sounds like it is coming up due again.  Tells me  it was negative.  Past Medical History:  Diagnosis Date   Coronary artery disease    Hypertension     Past Surgical History:  Procedure Laterality Date   AORTA SURGERY      Family history: No GI cancers  Social History   Tobacco Use   Smoking status: Never  Substance Use Topics   Alcohol use: No   Drug use: No    Prior to Admission medications   Medication Sig Start Date End Date Taking? Authorizing Provider  amLODipine (NORVASC) 10 MG tablet Take 10 mg by mouth daily.      [provider]  aspirin 325 MG tablet Take 325 mg by mouth daily.      [provider]  cloNIDine (CATAPRES) 0.1 MG tablet Take 0.1 mg by mouth daily.      [provider]  famotidine (PEPCID) 20 MG tablet Take 20 mg by mouth 2 (two) times daily.    [provider]  lisinopril (PRINIVIL,ZESTRIL) 2.5 MG tablet Take 2.5 mg by mouth daily.      [provider]  oxyCODONE-acetaminophen (PERCOCET/ROXICET) 5-325 MG tablet Take 1 tablet by mouth every 6 (six) hours as needed for severe pain. 07/07/19   Robinson, Jordan N, PA-C  sertraline (ZOLOFT) 100 MG tablet Take 100   mg by mouth daily. 04/12/19   [provider]  traZODone (DESYREL) 50 MG tablet Take 50 mg by mouth at bedtime.    [provider]    Current Facility-Administered Medications  Medication Dose Route Frequency Provider Last Rate Last Admin   0.9 %  sodium chloride infusion  10 mL/hr Intravenous Once Theron Arista, PA-C       Current Outpatient Medications  Medication Sig Dispense Refill   amLODipine (NORVASC) 10 MG tablet Take 10 mg by mouth daily.       aspirin 325 MG tablet Take 325 mg by mouth daily.       cloNIDine (CATAPRES) 0.1 MG tablet Take 0.1 mg by mouth daily.       famotidine (PEPCID) 20 MG tablet Take 20 mg by mouth 2 (two) times daily.     lisinopril (PRINIVIL,ZESTRIL) 2.5 MG tablet Take 2.5 mg by mouth daily.       oxyCODONE-acetaminophen (PERCOCET/ROXICET) 5-325 MG  tablet Take 1 tablet by mouth every 6 (six) hours as needed for severe pain. 12 tablet 0   sertraline (ZOLOFT) 100 MG tablet Take 100 mg by mouth daily.     traZODone (DESYREL) 50 MG tablet Take 50 mg by mouth at bedtime.      Allergies as of 04/17/2021   (No Known Allergies)     Review of Systems:    Constitutional: No weight loss, fever or chills Skin: No rash  Cardiovascular: No chest pain Respiratory: +DOE Gastrointestinal: See HPI and otherwise negative Genitourinary: No dysuria  Neurological: No headache, dizziness or syncope Musculoskeletal: No new muscle or joint pain Hematologic: No bleeding  Psychiatric: No history of depression or anxiety    Physical Exam:  Vital signs in last 24 hours: Temp:  [98.5 F (36.9 C)] 98.5 F (36.9 C) (10/25 1157) Pulse Rate:  [96] 96 (10/25 1157) Resp:  [18] 18 (10/25 1157) BP: (123)/(90) 123/90 (10/25 1157) SpO2:  [100 %] 100 % (10/25 1157) Weight:  [60.8 kg] 60.8 kg (10/25 1159)   General:   Pleasant Caucasian female appears to be in NAD, Well developed, Well nourished, alert and cooperative Head:  Normocephalic and atraumatic. Eyes:   PEERL, EOMI. No icterus. Conjunctiva pink. Ears:  Normal auditory acuity. Neck:  Supple Throat: Oral cavity and pharynx without inflammation, swelling or lesion.  Lungs: Respirations even and unlabored. Lungs clear to auscultation bilaterally.   No wheezes, crackles, or rhonchi.  Heart: Normal S1, S2. No MRG. Regular rate and rhythm. No peripheral edema, cyanosis or pallor. + Midline scar down her sternum Abdomen:  Soft, nondistended, nontender. No rebound or guarding. Normal bowel sounds. No appreciable masses or hepatomegaly. Rectal:  Not performed.  Msk:  Symmetrical without gross deformities. Peripheral pulses intact.  Extremities:  Without edema, no deformity or joint abnormality.  Neurologic:  Alert and  oriented x4;  grossly normal neurologically.  Skin:   Dry and intact without significant  lesions or rashes. Psychiatric: Demonstrates good judgement and reason without abnormal affect or behaviors.   LAB RESULTS: Recent Labs    04/17/21 1255  WBC 6.7  HGB 5.7*  HCT 21.3*  PLT 560*   BMET Recent Labs    04/17/21 1255  NA 136  K 3.7  CL 103  CO2 22  GLUCOSE 107*  BUN 8  CREATININE 1.30*  CALCIUM 9.0    STUDIES: DG Chest 2 View  Result Date: 04/17/2021 CLINICAL DATA:  Shortness of breath EXAM: CHEST - 2 VIEW COMPARISON:  Chest  x-ray 05/06/2015 FINDINGS: Heart is mildly enlarged. Mediastinum within normal limits. Median sternotomy wires. Pulmonary vasculature appears normal. No focal consolidation identified. No pleural effusion or pneumothorax. Moderate to large hiatal hernia. IMPRESSION: 1. No acute intrathoracic process identified. 2. Mild cardiomegaly. 3. Hiatal hernia. Electronically Signed   By: Delaney  Williams   On: 04/17/2021 13:22     Impression / Plan:   Impression: 1.  Anemia: Last hemoglobin checked in 2016 was normal, now 5.7 with patient complaining of dyspnea on exertion and palpitations, reports a "dark" stool over the past 3 weeks but has been using Pepto-Bismol she had run out of her Omeprazole 40 daily which she uses for reflux, also on a Aspirin 325 mg daily for "her heart", no previous EGD/colonoscopy, previous Cologuard which was negative per the patient; consider GI bleed/source versus other 2.  Aortic aneurysm repair in 2010 3.  Dyspnea on exertion: Related to anemia  Plan: 1.  Scheduled patient for an EGD and colonoscopy with Dr. Cunningham tomorrow.  Did discuss risks, benefits, limitations and alternatives and patient agrees to proceed. 2.  Ordered movi prep to be started now in split dose fashion.  Patient be on clears for the rest the day and n.p.o. at midnight. 3.  Continue the patient on Pantoprazole 40 mg IV twice daily for now 4.  Agree with 2 units PRBCs, hemoglobin should be above 7 for procedure tomorrow.  Continue to monitor  hemoglobin every 8 hours with transfusion as needed  Thank you for your kind consultation, we will continue to follow.  Rodert Hinch Lynne Tijuan Dantes  04/17/2021, 2:22 PM    

## 2021-04-17 NOTE — ED Triage Notes (Signed)
Pt endorses SOB for a few weeks. Denies CP, n/v.

## 2021-04-17 NOTE — H&P (Signed)
History and Physical    [name] KTG:[id number] DOB: 1965/09/26 DOA: 04/17/2021  PCP: Gwenyth Bender, MD (Confirm with patient/family/NH records and if not entered, this has to be entered at [facility] point of entry) Patient coming from: Home  I have personally briefly reviewed patient's old medical records in [facility]  Chief Complaint: Feeling tired, SOB  HPI: Joan [name] is a 55 y.o. female with medical history significant of aortic stenosis s/p biosynthetic aortic valve replacement, CAD status post CABG x1, GERD, HTN, HLD, anxiety depression, presented with increasing fatigue, shortness of breath for 1 week.  Patient started to develop exertional dyspnea and fatigue about 1 week ago and progressively getting worse.  Last 2 days, even minimum activity triggered significant shortness of breath.  Denies any chest pains, no lightheadedness, no cough no fever or chills.  She also noticed occasionally passing dark-colored stool recently, but no abdominal pain.  She went to see her PCP yesterday and was noted blood pressure borderline low and PCP recommended hold lisinopril.  ED Course: Blood pressure plan low, hemoglobin 5.7 platelet 560, creatinine 1.3, BUN 18, reticulocyte count 1.9%.  PRBC x2 ordered in the ED.  Review of Systems: As per HPI otherwise 14 point review of systems negative.    Past Medical History:  Diagnosis Date   Coronary artery disease    Hypertension     Past Surgical History:  Procedure Laterality Date   AORTA SURGERY       reports that she has never smoked. She does not have any smokeless tobacco history on file. She reports that she does not drink alcohol and does not use drugs.  No Known Allergies  No family history on file.   Prior to Admission medications   Medication Sig Start Date End Date Taking? Authorizing Provider  amLODipine (NORVASC) 10 MG tablet Take 10 mg by mouth daily.      [provider]  aspirin 325 MG tablet  Take 325 mg by mouth daily.      [provider]  cloNIDine (CATAPRES) 0.1 MG tablet Take 0.1 mg by mouth daily.      [provider]  famotidine (PEPCID) 20 MG tablet Take 20 mg by mouth 2 (two) times daily.    [provider]  lisinopril (PRINIVIL,ZESTRIL) 2.5 MG tablet Take 2.5 mg by mouth daily.      [provider]  oxyCODONE-acetaminophen (PERCOCET/ROXICET) 5-325 MG tablet Take 1 tablet by mouth every 6 (six) hours as needed for severe pain. 07/07/19   Robinson, [place] N, PA-C  sertraline (ZOLOFT) 100 MG tablet Take 100 mg by mouth daily. 04/12/19   [provider]  traZODone (DESYREL) 50 MG tablet Take 50 mg by mouth at bedtime.    [provider]    Physical Exam: Vitals:   04/17/21 1157 04/17/21 1159 04/17/21 1415 04/17/21 1445  BP: 123/90  120/72 110/73  Pulse: 96  81 72  Resp: 18  (!) 25 20  Temp: 98.5 F (36.9 C)     TempSrc: Oral     SpO2: 100%  98% 95%  Weight:  60.8 kg    Height:  5\' 4"  (1.626 m)      Constitutional: NAD, calm, comfortable Vitals:   04/17/21 1157 04/17/21 1159 04/17/21 1415 04/17/21 1445  BP: 123/90  120/72 110/73  Pulse: 96  81 72  Resp: 18  (!) 25 20  Temp: 98.5 F (36.9 C)     TempSrc: Oral  SpO2: 100%  98% 95%  Weight:  60.8 kg    Height:  5\' 4"  (1.626 m)     Eyes: PERRL, lids and conjunctivae normal ENMT: Mucous membranes are moist. Posterior pharynx clear of any exudate or lesions.Normal dentition.  Neck: normal, supple, no masses, no thyromegaly Respiratory: clear to auscultation bilaterally, no wheezing, no crackles. Normal respiratory effort. No accessory muscle use.  Cardiovascular: Regular rate and rhythm, soft systolic murmur on heart base. No extremity edema. 2+ pedal pulses. No carotid bruits.  Abdomen: no tenderness, no masses palpated. No hepatosplenomegaly. Bowel sounds positive.  Musculoskeletal: no clubbing / cyanosis. No joint deformity upper and lower extremities.  Good ROM, no contractures. Normal muscle tone.  Skin: no rashes, lesions, ulcers. No induration Neurologic: CN 2-12 grossly intact. Sensation intact, DTR normal. Strength 5/5 in all 4.  Psychiatric: Normal judgment and insight. Alert and oriented x 3. Normal mood.    Labs on Admission: I have personally reviewed following labs and imaging studies  CBC: Recent Labs  Lab 04/17/21 1255  WBC 6.7  NEUTROABS 3.6  HGB 5.7*  HCT 21.3*  MCV 64.4*  PLT 560*   Basic Metabolic Panel: Recent Labs  Lab 04/17/21 1255  NA 136  K 3.7  CL 103  CO2 22  GLUCOSE 107*  BUN 8  CREATININE 1.30*  CALCIUM 9.0   GFR: Estimated Creatinine Clearance: 42.2 mL/min (A) (by C-G formula based on SCr of 1.3 mg/dL (H)). Liver Function Tests: No results for input(s): AST, ALT, ALKPHOS, BILITOT, PROT, ALBUMIN in the last 168 hours. No results for input(s): LIPASE, AMYLASE in the last 168 hours. No results for input(s): AMMONIA in the last 168 hours. Coagulation Profile: No results for input(s): INR, PROTIME in the last 168 hours. Cardiac Enzymes: No results for input(s): CKTOTAL, CKMB, CKMBINDEX, TROPONINI in the last 168 hours. BNP (last 3 results) No results for input(s): PROBNP in the last 8760 hours. HbA1C: No results for input(s): HGBA1C in the last 72 hours. CBG: No results for input(s): GLUCAP in the last 168 hours. Lipid Profile: No results for input(s): CHOL, HDL, LDLCALC, TRIG, CHOLHDL, LDLDIRECT in the last 72 hours. Thyroid Function Tests: No results for input(s): TSH, T4TOTAL, FREET4, T3FREE, THYROIDAB in the last 72 hours. Anemia Panel: Recent Labs    04/17/21 1348  VITAMINB12 212  FOLATE 17.7  FERRITIN 3*  TIBC 668*  IRON 12*  RETICCTPCT 1.9   Urine analysis:    Component Value Date/Time   COLORURINE YELLOW 05/06/2015 1301   APPEARANCEUR CLOUDY (A) 05/06/2015 1301   LABSPEC 1.007 05/06/2015 1301   PHURINE 7.0 05/06/2015 1301   GLUCOSEU NEGATIVE 05/06/2015 1301   HGBUR  NEGATIVE 05/06/2015 1301   BILIRUBINUR NEGATIVE 05/06/2015 1301   KETONESUR NEGATIVE 05/06/2015 1301   PROTEINUR NEGATIVE 05/06/2015 1301   UROBILINOGEN 1.0 05/06/2015 1301   NITRITE NEGATIVE 05/06/2015 1301   LEUKOCYTESUR SMALL (A) 05/06/2015 1301    Radiological Exams on Admission: DG Chest 2 View  Result Date: 04/17/2021 CLINICAL DATA:  Shortness of breath EXAM: CHEST - 2 VIEW COMPARISON:  Chest x-ray 05/06/2015 FINDINGS: Heart is mildly enlarged. Mediastinum within normal limits. Median sternotomy wires. Pulmonary vasculature appears normal. No focal consolidation identified. No pleural effusion or pneumothorax. Moderate to large hiatal hernia. IMPRESSION: 1. No acute intrathoracic process identified. 2. Mild cardiomegaly. 3. Hiatal hernia. Electronically Signed   By: 13/05/2015   On: 04/17/2021 13:22    EKG: Independently reviewed.  Sinus tachycardia, chronic RBBB  Assessment/Plan  Active Problems:   GI bleed   Anemia  (please populate well all problems here in Problem List. (For example, if patient is on BP meds at home and you resume or decide to hold them, it is a problem that needs to be her. Same for CAD, COPD, HLD and so on)  Symptomatic anemia -Macrocytic, secondary to iron deficiency likely chronic GI loss. -PPI twice daily -PRBC x2 -GI consult, plans for pan scope tomorrow, clear liquid diet, n.p.o. after midnight. -Depressed reticulocyte count probably secondary to severe iron deficiency.  IV iron x1 tomorrow, then p.o. supplement.  Hx of CAD -No chest pain, no acute ST changes on EKG  HTN -Blood pressure borderline low, we will hold off on home BP meds for now, as needed hydralazine for now.  AS s/p repair -Outpatient cardiology F/U.  Anxiety/depression -Continue SSRI  DVT prophylaxis: SCD Code Status: Full Code Family Communication: None at bedside Disposition Plan: Expect more than 2 midnight hospital stay for GI work-up blood  transfusion. Consults called: GI Admission status: Tele admit   Emeline General MD [facility] Pager [phone]  04/17/2021, 4:17 PM
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
## 2021-04-17 NOTE — Consult Note (Signed)
Consultation  Referring Provider: Dr. Lynelle Doctor    Primary Care Physician:  Gwenyth Bender, MD Primary Gastroenterologist: Gentry Fitz       Reason for Consultation: Dark stool, anemia            HPI:   Joan [name] is a 55 y.o. female with a past medical history of CAD and hypertension who presented to the ER today with shortness of breath over the past month.  Apparently this was worsened by walking around and alleviated by resting.  In the ER patient found to have a hemoglobin of 5.7 and reported some black stools.  We were called in regards to this anemia and dark stools.    Today, patient reports that over the past month she has had increasing fatigue and dyspnea on exertion.  She went to see her PCP today who told her to go ahead and get to the ER.  Explains that other than the shortness of breath and some fatigue with some occasional "palpitations", she had been doing okay other than running out of her medications including Omeprazole 40 mg which she is on once daily for reflux over the past 3 to 4 weeks.  Tells me that in its place she has been using Pepto-Bismol daily, often more than once a day to control her symptoms.  Along with this has also noticed black/dark stools over the past 3 to 4 weeks.  Denies abdominal pain or change in bowel habits.  Normally has 1 bowel movement every other day with minimal discomfort in between.    Describes history of what sounds like an aortic aneurysm repair back in 2010.  She stays on a Aspirin 325 mg daily over the past few years.  Denies other NSAID use.  Does report a beer every other night or so.    Denies fever, chills, weight loss, nausea or vomiting.  ER course: Chest x-ray with no acute intrathoracic process, mild cardiomegaly and hiatal hernia; hemoglobin 5.7 (last known hemoglobin 14.1 on 05/06/2015), fecal occult negative  GI history: Cologuard through her PCP, she has done this once and it sounds like it is coming up due again.  Tells me  it was negative.  Past Medical History:  Diagnosis Date   Coronary artery disease    Hypertension     Past Surgical History:  Procedure Laterality Date   AORTA SURGERY      Family history: No GI cancers  Social History   Tobacco Use   Smoking status: Never  Substance Use Topics   Alcohol use: No   Drug use: No    Prior to Admission medications   Medication Sig Start Date End Date Taking? Authorizing Provider  amLODipine (NORVASC) 10 MG tablet Take 10 mg by mouth daily.      [provider]  aspirin 325 MG tablet Take 325 mg by mouth daily.      [provider]  cloNIDine (CATAPRES) 0.1 MG tablet Take 0.1 mg by mouth daily.      [provider]  famotidine (PEPCID) 20 MG tablet Take 20 mg by mouth 2 (two) times daily.    [provider]  lisinopril (PRINIVIL,ZESTRIL) 2.5 MG tablet Take 2.5 mg by mouth daily.      [provider]  oxyCODONE-acetaminophen (PERCOCET/ROXICET) 5-325 MG tablet Take 1 tablet by mouth every 6 (six) hours as needed for severe pain. 07/07/19   Robinson, [place] N, PA-C  sertraline (ZOLOFT) 100 MG tablet Take 100  mg by mouth daily. 04/12/19   [provider]  traZODone (DESYREL) 50 MG tablet Take 50 mg by mouth at bedtime.    [provider]    Current Facility-Administered Medications  Medication Dose Route Frequency Provider Last Rate Last Admin   0.9 %  sodium chloride infusion  10 mL/hr Intravenous Once Theron Arista, PA-C       Current Outpatient Medications  Medication Sig Dispense Refill   amLODipine (NORVASC) 10 MG tablet Take 10 mg by mouth daily.       aspirin 325 MG tablet Take 325 mg by mouth daily.       cloNIDine (CATAPRES) 0.1 MG tablet Take 0.1 mg by mouth daily.       famotidine (PEPCID) 20 MG tablet Take 20 mg by mouth 2 (two) times daily.     lisinopril (PRINIVIL,ZESTRIL) 2.5 MG tablet Take 2.5 mg by mouth daily.       oxyCODONE-acetaminophen (PERCOCET/ROXICET) 5-325 MG  tablet Take 1 tablet by mouth every 6 (six) hours as needed for severe pain. 12 tablet 0   sertraline (ZOLOFT) 100 MG tablet Take 100 mg by mouth daily.     traZODone (DESYREL) 50 MG tablet Take 50 mg by mouth at bedtime.      Allergies as of 04/17/2021   (No Known Allergies)     Review of Systems:    Constitutional: No weight loss, fever or chills Skin: No rash  Cardiovascular: No chest pain Respiratory: +DOE Gastrointestinal: See HPI and otherwise negative Genitourinary: No dysuria  Neurological: No headache, dizziness or syncope Musculoskeletal: No new muscle or joint pain Hematologic: No bleeding  Psychiatric: No history of depression or anxiety    Physical Exam:  Vital signs in last 24 hours: Temp:  [98.5 F (36.9 C)] 98.5 F (36.9 C) (10/25 1157) Pulse Rate:  [96] 96 (10/25 1157) Resp:  [18] 18 (10/25 1157) BP: (123)/(90) 123/90 (10/25 1157) SpO2:  [100 %] 100 % (10/25 1157) Weight:  [60.8 kg] 60.8 kg (10/25 1159)   General:   Pleasant Caucasian female appears to be in NAD, Well developed, Well nourished, alert and cooperative Head:  Normocephalic and atraumatic. Eyes:   PEERL, EOMI. No icterus. Conjunctiva pink. Ears:  Normal auditory acuity. Neck:  Supple Throat: Oral cavity and pharynx without inflammation, swelling or lesion.  Lungs: Respirations even and unlabored. Lungs clear to auscultation bilaterally.   No wheezes, crackles, or rhonchi.  Heart: Normal S1, S2. No MRG. Regular rate and rhythm. No peripheral edema, cyanosis or pallor. + Midline scar down her sternum Abdomen:  Soft, nondistended, nontender. No rebound or guarding. Normal bowel sounds. No appreciable masses or hepatomegaly. Rectal:  Not performed.  Msk:  Symmetrical without gross deformities. Peripheral pulses intact.  Extremities:  Without edema, no deformity or joint abnormality.  Neurologic:  Alert and  oriented x4;  grossly normal neurologically.  Skin:   Dry and intact without significant  lesions or rashes. Psychiatric: Demonstrates good judgement and reason without abnormal affect or behaviors.   LAB RESULTS: Recent Labs    04/17/21 1255  WBC 6.7  HGB 5.7*  HCT 21.3*  PLT 560*   BMET Recent Labs    04/17/21 1255  NA 136  K 3.7  CL 103  CO2 22  GLUCOSE 107*  BUN 8  CREATININE 1.30*  CALCIUM 9.0    STUDIES: DG Chest 2 View  Result Date: 04/17/2021 CLINICAL DATA:  Shortness of breath EXAM: CHEST - 2 VIEW COMPARISON:  Chest  x-ray 05/06/2015 FINDINGS: Heart is mildly enlarged. Mediastinum within normal limits. Median sternotomy wires. Pulmonary vasculature appears normal. No focal consolidation identified. No pleural effusion or pneumothorax. Moderate to large hiatal hernia. IMPRESSION: 1. No acute intrathoracic process identified. 2. Mild cardiomegaly. 3. Hiatal hernia. Electronically Signed   By: Jannifer Hick   On: 04/17/2021 13:22     Impression / Plan:   Impression: 1.  Anemia: Last hemoglobin checked in 2016 was normal, now 5.7 with patient complaining of dyspnea on exertion and palpitations, reports a "dark" stool over the past 3 weeks but has been using Pepto-Bismol she had run out of her Omeprazole 40 daily which she uses for reflux, also on a Aspirin 325 mg daily for "her heart", no previous EGD/colonoscopy, previous Cologuard which was negative per the patient; consider GI bleed/source versus other 2.  Aortic aneurysm repair in 2010 3.  Dyspnea on exertion: Related to anemia  Plan: 1.  Scheduled patient for an EGD and colonoscopy with Dr. Tomasa Rand tomorrow.  Did discuss risks, benefits, limitations and alternatives and patient agrees to proceed. 2.  Ordered movi prep to be started now in split dose fashion.  Patient be on clears for the rest the day and n.p.o. at midnight. 3.  Continue the patient on Pantoprazole 40 mg IV twice daily for now 4.  Agree with 2 units PRBCs, hemoglobin should be above 7 for procedure tomorrow.  Continue to monitor  hemoglobin every 8 hours with transfusion as needed  Thank you for your kind consultation, we will continue to follow.  Violet Baldy Trevontae Lindahl  04/17/2021, 2:22 PM

## 2021-04-17 NOTE — ED Provider Notes (Signed)
Emergency Medicine Provider Triage Evaluation Note  Joan [name] , a 55 y.o. female  was evaluated in triage.  Pt complains of progressive worsening shortness of breath for the last month without chest pain or palpitations.  She does states she was seen by her doctor and told to come to the ED a few weeks ago but did not present.  History of aortic repair though she is not sure what for.  Reports oxygen saturation in the 80s.  Review of Systems  Positive: Shortness of breath, DOE Negative: Palpitations, chest pain  Physical Exam  BP 123/90   Pulse 96   Temp 98.5 F (36.9 C) (Oral)   Resp 18   Ht 5\' 4"  (1.626 m)   Wt 60.8 kg   SpO2 100%   BMI 23.00 kg/m  Gen:   Awake, no distress   Resp:  Normal effort  MSK:   Moves extremities without difficulty  Other:  Systolic murmur, grade 2, RRR no M/R/G.  Lungs CTA B.  Medical Decision Making  Medically screening exam initiated at 12:13 PM.  Appropriate orders placed.  Joan [name] was informed that the remainder of the evaluation will be completed by another provider, this initial triage assessment does not replace that evaluation, and the importance of remaining in the ED until their evaluation is complete.  This chart was dictated using voice recognition software, Dragon. Despite the best efforts of this provider to proofread and correct errors, errors may still occur which can change documentation meaning.    Lexine Baton, PA-C 04/17/21 1235    04/19/21, DO 04/17/21 1327

## 2021-04-18 ENCOUNTER — Encounter (HOSPITAL_COMMUNITY)
Admission: EM | Disposition: A | Discharge status: Home / Self Care | Payer: Self-pay | Source: Home / Self Care | Attending: Family Medicine

## 2021-04-18 ENCOUNTER — Encounter (HOSPITAL_COMMUNITY): Payer: Self-pay | Admitting: Internal Medicine

## 2021-04-18 ENCOUNTER — Inpatient Hospital Stay (HOSPITAL_COMMUNITY): Payer: Self-pay | Admitting: Certified Registered Nurse Anesthetist

## 2021-04-18 DIAGNOSIS — K922 Gastrointestinal hemorrhage, unspecified: Principal | ICD-10-CM

## 2021-04-18 HISTORY — PX: COLONOSCOPY WITH PROPOFOL: SHX5780

## 2021-04-18 HISTORY — PX: GIVENS CAPSULE STUDY: SHX5432

## 2021-04-18 HISTORY — PX: BIOPSY: SHX5522

## 2021-04-18 HISTORY — PX: ESOPHAGOGASTRODUODENOSCOPY (EGD) WITH PROPOFOL: SHX5813

## 2021-04-18 LAB — CBC WITH DIFFERENTIAL/PLATELET
Abs Immature Granulocytes: 0.01 10*3/uL (ref 0.00–0.07)
Basophils Absolute: 0.1 10*3/uL (ref 0.0–0.1)
Basophils Relative: 1 %
Eosinophils Absolute: 0.4 10*3/uL (ref 0.0–0.5)
Eosinophils Relative: 8 %
HCT: 28.3 % — ABNORMAL LOW (ref 36.0–46.0)
Hemoglobin: 8.4 g/dL — ABNORMAL LOW (ref 12.0–15.0)
Immature Granulocytes: 0 %
Lymphocytes Relative: 29 %
Lymphs Abs: 1.4 10*3/uL (ref 0.7–4.0)
MCH: 20.9 pg — ABNORMAL LOW (ref 26.0–34.0)
MCHC: 29.7 g/dL — ABNORMAL LOW (ref 30.0–36.0)
MCV: 70.4 fL — ABNORMAL LOW (ref 80.0–100.0)
Monocytes Absolute: 0.6 10*3/uL (ref 0.1–1.0)
Monocytes Relative: 12 %
Neutro Abs: 2.4 10*3/uL (ref 1.7–7.7)
Neutrophils Relative %: 50 %
Platelets: 391 10*3/uL (ref 150–400)
RBC: 4.02 MIL/uL (ref 3.87–5.11)
RDW: 22.2 % — ABNORMAL HIGH (ref 11.5–15.5)
WBC: 4.9 10*3/uL (ref 4.0–10.5)
nRBC: 0.4 % — ABNORMAL HIGH (ref 0.0–0.2)

## 2021-04-18 LAB — TYPE AND SCREEN
ABO/RH(D): A POS
Antibody Screen: NEGATIVE
Unit division: 0
Unit division: 0

## 2021-04-18 LAB — BPAM RBC
Blood Product Expiration Date: 202211052359
Blood Product Expiration Date: 202211072359
ISSUE DATE / TIME: 202210251646
ISSUE DATE / TIME: 202210251944
Unit Type and Rh: 6200
Unit Type and Rh: 6200

## 2021-04-18 LAB — HEMOGLOBIN: Hemoglobin: 8.5 g/dL — ABNORMAL LOW (ref 12.0–15.0)

## 2021-04-18 LAB — HEMOGLOBIN AND HEMATOCRIT, BLOOD
HCT: 29.6 % — ABNORMAL LOW (ref 36.0–46.0)
Hemoglobin: 8.6 g/dL — ABNORMAL LOW (ref 12.0–15.0)

## 2021-04-18 LAB — LACTATE DEHYDROGENASE: LDH: 164 U/L (ref 98–192)

## 2021-04-18 LAB — HIV ANTIBODY (ROUTINE TESTING W REFLEX): HIV Screen 4th Generation wRfx: NONREACTIVE

## 2021-04-18 SURGERY — COLONOSCOPY WITH PROPOFOL
Anesthesia: Monitor Anesthesia Care

## 2021-04-18 SURGERY — IMAGING PROCEDURE, GI TRACT, INTRALUMINAL, VIA CAPSULE

## 2021-04-18 MED ORDER — SERTRALINE HCL 100 MG PO TABS
100.0000 mg | ORAL_TABLET | Freq: Every day | ORAL | Status: DC
  Administered 2021-04-18 – 2021-04-19 (×2): 100 mg via ORAL
  Filled 2021-04-18 (×2): qty 1

## 2021-04-18 MED ORDER — ASPIRIN 325 MG PO TABS
325.0000 mg | ORAL_TABLET | Freq: Every day | ORAL | Status: DC
  Administered 2021-04-18 – 2021-04-19 (×2): 325 mg via ORAL
  Filled 2021-04-18 (×2): qty 1

## 2021-04-18 MED ORDER — ONDANSETRON HCL 4 MG PO TABS: 4.0000 mg | ORAL_TABLET | Freq: Four times a day (QID) | ORAL | Status: DC | PRN

## 2021-04-18 MED ORDER — ONDANSETRON HCL 4 MG/2ML IJ SOLN
4.0000 mg | Freq: Four times a day (QID) | INTRAMUSCULAR | Status: DC | PRN
  Administered 2021-04-18: 4 mg via INTRAVENOUS
  Filled 2021-04-18 (×2): qty 2

## 2021-04-18 MED ORDER — HYDRALAZINE HCL 25 MG PO TABS: 25.0000 mg | ORAL_TABLET | Freq: Four times a day (QID) | ORAL | Status: DC | PRN

## 2021-04-18 MED ORDER — PROPOFOL 500 MG/50ML IV EMUL
INTRAVENOUS | Status: DC | PRN
  Administered 2021-04-18: 75 ug/kg/min via INTRAVENOUS

## 2021-04-18 MED ORDER — OXYCODONE-ACETAMINOPHEN 5-325 MG PO TABS: 1.0000 | ORAL_TABLET | Freq: Four times a day (QID) | ORAL | Status: DC | PRN

## 2021-04-18 MED ORDER — TRAZODONE HCL 50 MG PO TABS
50.0000 mg | ORAL_TABLET | Freq: Every day | ORAL | Status: DC
  Administered 2021-04-18: 50 mg via ORAL
  Filled 2021-04-18: qty 1

## 2021-04-18 MED ORDER — SODIUM CHLORIDE 0.9 % IV SOLN: INTRAVENOUS | Status: DC

## 2021-04-18 MED ORDER — PROPOFOL 10 MG/ML IV BOLUS
INTRAVENOUS | Status: DC | PRN
  Administered 2021-04-18: 40 mg via INTRAVENOUS
  Administered 2021-04-18 (×3): 30 mg via INTRAVENOUS

## 2021-04-18 NOTE — Plan of Care (Signed)

## 2021-04-18 NOTE — Progress Notes (Signed)
[facility]  PROGRESS NOTE  Joan [name] HYW:[id number] DOB: 31-May-1966 DOA: 04/17/2021 PCP: Gwenyth Bender, MD   Brief HPI:    55 year old female with medical history of aortic stenosis, s/p bioprosthetic valve replacement, CAD s/p CABG x1, GERD, hypertension, hyperlipidemia, anxiety, depression presented with increasing fatigue, shortness of breath for 1 week.  In the ED she was found to have a hemoglobin of 5.7, platelets 568, creatinine 1.3, BUN 18, PRBC x2 ordered in the ED.   Subjective   Patient seen and examined, underwent EGD and colonoscopy which were unremarkable.  Currently undergoing capsule endoscopy.  Hemoglobin is 8.6 today.   Assessment/Plan:    GI bleed -Unclear etiology -Underwent EGD and colonoscopy which were unremarkable -Underwent capsule endoscopy per GI  Symptomatic anemia -Hemoglobin up to 8.6 after 2 units PRBC -Work-up: GI as above  History of CAD -No chest pain -No acute changes on EKG  Hypertension -Blood pressure stable -BP meds on hold -Continue as needed hydralazine  Aortic stenosis status post repair -Follow-up cardiology as outpatient  Anxiety/depression -Continue SSRI   Scheduled medications:    aspirin  325 mg Oral Daily   ferrous sulfate  325 mg Oral Q breakfast   pantoprazole (PROTONIX) IV  40 mg Intravenous Q12H   sertraline  100 mg Oral Daily   traZODone  50 mg Oral QHS     Data Reviewed:   CBG:  No results for input(s): GLUCAP in the last 168 hours.  SpO2: 96 %    Vitals:   04/18/21 1135 04/18/21 1150 04/18/21 1201 04/18/21 1302  BP: 119/68 134/79  127/74  Pulse: 67 65  67  Resp: (!) 23 14  19   Temp:  98.2 F (36.8 C)  98.1 F (36.7 C)  TempSrc:    Oral  SpO2: 100% 100%  96%  Weight:   62.1 kg   Height:   5\' 4"  (1.626 m)      Intake/Output Summary (Last 24 hours) at 04/18/2021 1959 Last data filed at 04/18/2021 1731 Gross per 24 hour  Intake 1733.67 ml  Output --  Net 1733.67 ml     10/25 0701 - 10/26 1900 In: 1733.7 [P.O.:840; I.V.:550] Out: -   Filed Weights   04/18/21 0246 04/18/21 0841 04/18/21 1201  Weight: 60.8 kg 62.1 kg 62.1 kg    Data Reviewed: Basic Metabolic Panel: Recent Labs  Lab 04/17/21 1255  NA 136  K 3.7  CL 103  CO2 22  GLUCOSE 107*  BUN 8  CREATININE 1.30*  CALCIUM 9.0   Liver Function Tests: No results for input(s): AST, ALT, ALKPHOS, BILITOT, PROT, ALBUMIN in the last 168 hours. No results for input(s): LIPASE, AMYLASE in the last 168 hours. No results for input(s): AMMONIA in the last 168 hours. CBC: Recent Labs  Lab 04/17/21 1255 04/18/21 0337 04/18/21 1237  WBC 6.7 4.9  --   NEUTROABS 3.6 2.4  --   HGB 5.7* 8.4*  8.5* 8.6*  HCT 21.3* 28.3* 29.6*  MCV 64.4* 70.4*  --   PLT 560* 391  --         Radiology Reports  DG Chest 2 View  Result Date: 04/17/2021 CLINICAL DATA:  Shortness of breath EXAM: CHEST - 2 VIEW COMPARISON:  Chest x-ray 05/06/2015 FINDINGS: Heart is mildly enlarged. Mediastinum within normal limits. Median sternotomy wires. Pulmonary vasculature appears normal. No focal consolidation identified. No pleural effusion or pneumothorax. Moderate to large hiatal hernia. IMPRESSION: 1. No acute intrathoracic process identified. 2.  Mild cardiomegaly. 3. Hiatal hernia. Electronically Signed   By: Jannifer Hick   On: 04/17/2021 13:22       Antibiotics: Anti-infectives (From admission, onward)    None         DVT prophylaxis: SCDs  Code Status: Full code  Family Communication: No family at bedside   Consultants: Gastroenterology  Procedures:     Objective    Physical Examination:   General-appears in no acute distress Heart-S1-S2, regular, no murmur auscultated Lungs-clear to auscultation bilaterally, no wheezing or crackles auscultated Abdomen-soft, nontender, no organomegaly Extremities-no edema in the lower extremities Neuro-alert, oriented x3, no focal deficit  noted  Status is: Inpatient  Dispo: The patient is from: Home              Anticipated d/c is to: Home              Anticipated d/c date is: 04/19/2021              Patient currently not stable for discharge  Barrier to discharge-undergoing capsule endoscopy  COVID-19 Labs  Recent Labs    04/17/21 1348 04/18/21 0337  FERRITIN 3*  --   LDH  --  164    Lab Results  Component Value Date   SARSCOV2NAA NEGATIVE 04/17/2021   SARSCOV2NAA Not Detected 01/20/2019            Recent Results (from the past 240 hour(s))  Resp Panel by RT-PCR (Flu A&B, Covid) Nasopharyngeal Swab     Status: None   Collection Time: 04/17/21  1:24 PM   Specimen: Nasopharyngeal Swab; Nasopharyngeal(NP) swabs in vial transport medium  Result Value Ref Range Status   SARS Coronavirus 2 by RT PCR NEGATIVE NEGATIVE Final    Comment: (NOTE) SARS-CoV-2 target nucleic acids are NOT DETECTED.  The SARS-CoV-2 RNA is generally detectable in upper respiratory specimens during the acute phase of infection. The lowest concentration of SARS-CoV-2 viral copies this assay can detect is 138 copies/mL. A negative result does not preclude SARS-Cov-2 infection and should not be used as the sole basis for treatment or other patient management decisions. A negative result may occur with  improper specimen collection/handling, submission of specimen other than nasopharyngeal swab, presence of viral mutation(s) within the areas targeted by this assay, and inadequate number of viral copies(<138 copies/mL). A negative result must be combined with clinical observations, patient history, and epidemiological information. The expected result is Negative.  Fact Sheet for Patients:  BloggerCourse.com  Fact Sheet for Healthcare Providers:  SeriousBroker.it  This test is no t yet approved or cleared by the [place] FDA and  has been authorized for detection and/or  diagnosis of SARS-CoV-2 by FDA under an Emergency Use Authorization (EUA). This EUA will remain  in effect (meaning this test can be used) for the duration of the COVID-19 declaration under Section 564(b)(1) of the Act, 21 U.S.C.section 360bbb-3(b)(1), unless the authorization is terminated  or revoked sooner.       Influenza A by PCR NEGATIVE NEGATIVE Final   Influenza B by PCR NEGATIVE NEGATIVE Final    Comment: (NOTE) The Xpert Xpress SARS-CoV-2/FLU/RSV plus assay is intended as an aid in the diagnosis of influenza from Nasopharyngeal swab specimens and should not be used as a sole basis for treatment. Nasal washings and aspirates are unacceptable for Xpert Xpress SARS-CoV-2/FLU/RSV testing.  Fact Sheet for Patients: BloggerCourse.com  Fact Sheet for Healthcare Providers: SeriousBroker.it  This test is not yet approved or cleared by the  [place] [employer] trader and has been authorized for detection and/or diagnosis of SARS-CoV-2 by FDA under an TEFL teacher (EUA). This EUA will remain in effect (meaning this test can be used) for the duration of the COVID-19 declaration under Section 564(b)(1) of the Act, 21 U.S.C. section 360bbb-3(b)(1), unless the authorization is terminated or revoked.  Performed at [facility], [address] [place], [place] [address]     Meredeth Ide   [facility] If 7PM-7AM, please contact night-coverage at [email or web address], Office  [phone]   04/18/2021, 7:59 PM  LOS: 1 day

## 2021-04-18 NOTE — Progress Notes (Signed)
Pt ingested pillcam at 1200 with no issues. Instructions reviewed with patient and RN. Instruction sheet sent with patient.

## 2021-04-18 NOTE — Anesthesia Preprocedure Evaluation (Signed)
Anesthesia Evaluation  Patient identified by MRN, date of birth, ID band Patient awake    Reviewed: Allergy & Precautions, NPO status , Patient's Chart, lab work & pertinent test results  History of Anesthesia Complications Negative for: history of anesthetic complications  Airway Mallampati: II  TM Distance: >3 FB Neck ROM: Full    Dental  (+) Poor Dentition, Chipped,    Pulmonary neg pulmonary ROS,    Pulmonary exam normal        Cardiovascular hypertension, Pt. on medications + CAD and + CABG  Normal cardiovascular exam  AS s/p AVR   Neuro/Psych negative neurological ROS  negative psych ROS   GI/Hepatic Neg liver ROS, GERD  Medicated and Controlled,  Endo/Other  negative endocrine ROS  Renal/GU negative Renal ROS  negative genitourinary   Musculoskeletal negative musculoskeletal ROS (+)   Abdominal   Peds  Hematology  (+) anemia , Hgb 8.5   Anesthesia Other Findings Day of surgery medications reviewed with patient.  Reproductive/Obstetrics negative OB ROS                             Anesthesia Physical Anesthesia Plan  ASA: 3  Anesthesia Plan: MAC   Post-op Pain Management:    Induction:   PONV Risk Score and Plan: Treatment may vary due to age or medical condition and Propofol infusion  Airway Management Planned: Natural Airway and Simple Face Mask  Additional Equipment: None  Intra-op Plan:   Post-operative Plan:   Informed Consent: I have reviewed the patients History and Physical, chart, labs and discussed the procedure including the risks, benefits and alternatives for the proposed anesthesia with the patient or authorized representative who has indicated his/her understanding and acceptance.       Plan Discussed with: CRNA  Anesthesia Plan Comments:         Anesthesia Quick Evaluation

## 2021-04-18 NOTE — Transfer of Care (Signed)
Immediate Anesthesia Transfer of Care Note  Patient: Joan [name]  Procedure(s) Performed: COLONOSCOPY WITH PROPOFOL ESOPHAGOGASTRODUODENOSCOPY (EGD) WITH PROPOFOL BIOPSY  Patient Location: PACU  Anesthesia Type:MAC  Level of Consciousness: awake, alert  and oriented  Airway & Oxygen Therapy: Patient Spontanous Breathing  Post-op Assessment: Report given to RN and Post -op Vital signs reviewed and stable  Post vital signs: Reviewed and stable  Last Vitals:  Vitals Value Taken Time  BP 103/69 04/18/21 1119  Temp    Pulse 66 04/18/21 1121  Resp 22 04/18/21 1121  SpO2 100 % 04/18/21 1121  Vitals shown include unvalidated device data.  Last Pain:  Vitals:   04/18/21 0841  TempSrc: Temporal  PainSc: 0-No pain      Patients Stated Pain Goal: 0 (17/51/02 5852)  Complications: No notable events documented.

## 2021-04-18 NOTE — Op Note (Signed)
[facility] Patient Name: Annasophia Crocker Procedure Date : 04/18/2021 MRN: [id number] Attending MD: Dub Amis. Tomasa Rand , MD Date of Birth: August 27, 1965 CSN: [id number] Age: 55 Admit Type: Inpatient Procedure:                Upper GI endoscopy Indications:              Iron deficiency anemia Providers:                Lorin Picket E. Tomasa Rand, MD, Willy Eddy, RN, Priscella Mann, Technician, Beryle Beams, Technician Referring MD:              Medicines:                Monitored Anesthesia Care Complications:            No immediate complications. Estimated Blood Loss:     Estimated blood loss was minimal. Procedure:                Pre-Anesthesia Assessment:                           - Prior to the procedure, a History and Physical                            was performed, and patient medications and                            allergies were reviewed. The patient's tolerance of                            previous anesthesia was also reviewed. The risks                            and benefits of the procedure and the sedation                            options and risks were discussed with the patient.                            All questions were answered, and informed consent                            was obtained. Prior Anticoagulants: The patient has                            taken no previous anticoagulant or antiplatelet                            agents. ASA Grade Assessment: II - A patient with                            mild systemic disease. After reviewing the risks  and benefits, the patient was deemed in                            satisfactory condition to undergo the procedure.                           After obtaining informed consent, the endoscope was                            passed under direct vision. Throughout the                            procedure, the patient's blood pressure, pulse, and                             oxygen saturations were monitored continuously. The                            [id number]-[id number] ([id number]) Olympus endoscope was introduced                            through the mouth, and advanced to the third part                            of duodenum. The upper GI endoscopy was                            accomplished without difficulty. The patient                            tolerated the procedure well. Scope In: Scope Out: Findings:      The examined portions of the nasopharynx, oropharynx and larynx were       normal.      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 The proximal extent of the       gastric folds (end of tubular esophagus) was 32 cm from the incisors.       The hiatal narrowing was 39 cm from the incisors. The Z-line was 32 cm       from the incisors.      The examined esophagus was normal.      The exam of the stomach was otherwise normal.      Biopsies were taken with a cold forceps at the incisura and in the       gastric antrum for Helicobacter pylori testing. Estimated blood loss was       minimal.      The examined duodenum was normal. Biopsies for histology were taken with       a cold forceps for evaluation of celiac disease. Estimated blood loss       was minimal. Impression:               - The examined portions of the nasopharynx,                            oropharynx and larynx were normal.                           -  6 cm paraesophageal hernia.                           - Normal esophagus.                           - Normal examined duodenum. Biopsied.                           - Biopsies were taken with a cold forceps for                            Helicobacter pylori testing.                           - No endoscopic abnormalities to explain patient's                            iron deficiency anemia. Given her large hiatal                            hernia, bleeding from Cameron's lesions is                            possible, but no such  lesions seen on today's exam. Recommendation:           - Return patient to hospital ward for ongoing care.                           - Clear liquid diet today.                           - Continue present medications.                           - Await pathology results.                           - Proceed with colonoscopy. Procedure Code(s):        --- Professional ---                           [id number], Esophagogastroduodenoscopy, flexible,                            transoral; with biopsy, single or multiple Diagnosis Code(s):        --- Professional ---                           K44.9, Diaphragmatic hernia without obstruction or                            gangrene                           D50.9, Iron deficiency anemia, unspecified CPT copyright 2019 American Medical Association. All rights reserved. The codes documented in this report are  preliminary and upon coder review may  be revised to meet current compliance requirements. Raul Winterhalter E. Tomasa Rand, MD 04/18/2021 11:17:09 AM This report has been signed electronically. Number of Addenda: 0

## 2021-04-18 NOTE — Anesthesia Procedure Notes (Signed)
Procedure Name: MAC Date/Time: 04/18/2021 10:20 AM Performed by: Carolan Clines, CRNA Pre-anesthesia Checklist: Patient identified, Emergency Drugs available, Suction available and Patient being monitored Oxygen Delivery Method: Nasal cannula Dental Injury: Teeth and Oropharynx as per pre-operative assessment

## 2021-04-18 NOTE — Interval H&P Note (Signed)
History and Physical Interval Note:  No changes in patient's symptoms or medical status overnight.  Iron panel consistent with severe iron deficiency.  Hemoglobin responded appropriately to 2 units pRBCs.  Plan for EGD and colonoscopy to identify source of IDA  04/18/2021 9:22 AM  Joan [name]  has presented today for surgery, with the diagnosis of Anemia.  The various methods of treatment have been discussed with the patient and family. After consideration of risks, benefits and other options for treatment, the patient has consented to  Procedure(s): COLONOSCOPY WITH PROPOFOL (N/A) ESOPHAGOGASTRODUODENOSCOPY (EGD) WITH PROPOFOL (N/A) as a surgical intervention.  The patient's history has been reviewed, patient examined, no change in status, stable for surgery.  I have reviewed the patient's chart and labs.  Questions were answered to the patient's satisfaction.     Jenel Lucks

## 2021-04-18 NOTE — Op Note (Signed)
[facility] Patient Name: Joan [name] Procedure Date : 04/18/2021 MRN: [id number] Attending MD: Dub Amis. Tomasa Rand , MD Date of Birth: 1966-02-21 CSN: [id number] Age: 55 Admit Type: Inpatient Procedure:                Colonoscopy Indications:              Iron deficiency anemia Providers:                Lorin Picket E. Tomasa Rand, MD, Willy Eddy, RN, Priscella Mann, Technician, Beryle Beams, Technician Referring MD:              Medicines:                Monitored Anesthesia Care Complications:            No immediate complications. Estimated Blood Loss:     Estimated blood loss: none. Procedure:                Pre-Anesthesia Assessment:                           - Prior to the procedure, a History and Physical                            was performed, and patient medications and                            allergies were reviewed. The patient's tolerance of                            previous anesthesia was also reviewed. The risks                            and benefits of the procedure and the sedation                            options and risks were discussed with the patient.                            All questions were answered, and informed consent                            was obtained. Prior Anticoagulants: The patient has                            taken no previous anticoagulant or antiplatelet                            agents. ASA Grade Assessment: II - A patient with                            mild systemic disease. After reviewing the risks  and benefits, the patient was deemed in                            satisfactory condition to undergo the procedure.                           - Prior to the procedure, a History and Physical                            was performed, and patient medications and                            allergies were reviewed. The patient's tolerance of                             previous anesthesia was also reviewed. The risks                            and benefits of the procedure and the sedation                            options and risks were discussed with the patient.                            All questions were answered, and informed consent                            was obtained. Prior Anticoagulants: The patient has                            taken no previous anticoagulant or antiplatelet                            agents. ASA Grade Assessment: II - A patient with                            mild systemic disease. After reviewing the risks                            and benefits, the patient was deemed in                            satisfactory condition to undergo the procedure.                           After obtaining informed consent, the colonoscope                            was passed under direct vision. Throughout the                            procedure, the patient's blood pressure, pulse, and  oxygen saturations were monitored continuously. The                            [id number]-HQ190L ([id number]) Olympus coloscope was                            introduced through the anus and advanced to the the                            terminal ileum, with identification of the                            appendiceal orifice and IC valve. The colonoscopy                            was somewhat difficult due to a tortuous colon.                            Successful completion of the procedure was aided by                            using manual pressure. The patient tolerated the                            procedure well. The quality of the bowel                            preparation was adequate. The terminal ileum,                            ileocecal valve, appendiceal orifice, and rectum                            were photographed. Scope In: 10:46:48 AM Scope Out: 11:08:26 AM Scope Withdrawal Time: 0 hours 14 minutes 55 seconds   Total Procedure Duration: 0 hours 21 minutes 38 seconds  Findings:      The perianal and digital rectal examinations were normal. Pertinent       negatives include normal sphincter tone and no palpable rectal lesions.      The colon (entire examined portion) appeared normal.      The terminal ileum appeared normal.      The retroflexed view of the distal rectum and anal verge was normal and       showed no anal or rectal abnormalities. Impression:               - The entire examined colon is normal.                           - The examined portion of the ileum was normal.                           - The distal rectum and anal verge are normal on  retroflexion view.                           - No specimens collected.                           - No endoscopic abnormalities to explain iron                            deficiency anemia. Recommendation:           - Return patient to hospital ward for ongoing care.                           - Clear liquid diet today.                           - Continue present medications.                           - Repeat colonoscopy in 10 years for screening                            purposes.                           - To visualize the small bowel, perform video                            capsule endoscopy today. Procedure Code(s):        --- Professional ---                           [phone], Colonoscopy, flexible; diagnostic, including                            collection of specimen(s) by brushing or washing,                            when performed (separate procedure) Diagnosis Code(s):        --- Professional ---                           D50.9, Iron deficiency anemia, unspecified CPT copyright 2019 American Medical Association. All rights reserved. The codes documented in this report are preliminary and upon coder review may  be revised to meet current compliance requirements. Jerry Haugen E. Tomasa Rand, MD 04/18/2021  11:21:40 AM This report has been signed electronically. Number of Addenda: 0

## 2021-04-18 NOTE — Anesthesia Postprocedure Evaluation (Signed)
Anesthesia Post Note  Patient: Joan [name]  Procedure(s) Performed: COLONOSCOPY WITH PROPOFOL ESOPHAGOGASTRODUODENOSCOPY (EGD) WITH PROPOFOL BIOPSY     Patient location during evaluation: PACU Anesthesia Type: MAC Level of consciousness: awake and alert and oriented Pain management: pain level controlled Vital Signs Assessment: post-procedure vital signs reviewed and stable Respiratory status: spontaneous breathing, nonlabored ventilation and respiratory function stable Cardiovascular status: blood pressure returned to baseline Postop Assessment: no apparent nausea or vomiting Anesthetic complications: no   No notable events documented.  Last Vitals:  Vitals:   04/18/21 1135 04/18/21 1150  BP: 119/68 134/79  Pulse: 67 65  Resp: (!) 23 14  Temp:    SpO2: 100% 100%    Last Pain:  Vitals:   04/18/21 1135  TempSrc:   PainSc: 0-No pain                 Marthenia Rolling

## 2021-04-19 ENCOUNTER — Other Ambulatory Visit: Payer: Self-pay | Admitting: Hematology and Oncology

## 2021-04-19 ENCOUNTER — Other Ambulatory Visit (HOSPITAL_COMMUNITY): Payer: Self-pay

## 2021-04-19 ENCOUNTER — Telehealth: Payer: Self-pay | Admitting: Hematology and Oncology

## 2021-04-19 ENCOUNTER — Encounter (HOSPITAL_COMMUNITY): Payer: Self-pay | Admitting: Gastroenterology

## 2021-04-19 DIAGNOSIS — D509 Iron deficiency anemia, unspecified: Secondary | ICD-10-CM | POA: Insufficient documentation

## 2021-04-19 LAB — BASIC METABOLIC PANEL
Anion gap: 6 (ref 5–15)
BUN: 7 mg/dL (ref 6–20)
CO2: 22 mmol/L (ref 22–32)
Calcium: 8.4 mg/dL — ABNORMAL LOW (ref 8.9–10.3)
Chloride: 107 mmol/L (ref 98–111)
Creatinine, Ser: 1.24 mg/dL — ABNORMAL HIGH (ref 0.44–1.00)
GFR, Estimated: 51 mL/min — ABNORMAL LOW (ref >60)
Glucose, Bld: 106 mg/dL — ABNORMAL HIGH (ref 70–99)
Potassium: 3.7 mmol/L (ref 3.5–5.1)
Sodium: 135 mmol/L (ref 135–145)

## 2021-04-19 LAB — CBC
HCT: 27.5 % — ABNORMAL LOW (ref 36.0–46.0)
Hemoglobin: 8 g/dL — ABNORMAL LOW (ref 12.0–15.0)
MCH: 20.5 pg — ABNORMAL LOW (ref 26.0–34.0)
MCHC: 29.1 g/dL — ABNORMAL LOW (ref 30.0–36.0)
MCV: 70.5 fL — ABNORMAL LOW (ref 80.0–100.0)
Platelets: 368 10*3/uL (ref 150–400)
RBC: 3.9 MIL/uL (ref 3.87–5.11)
RDW: 23.5 % — ABNORMAL HIGH (ref 11.5–15.5)
WBC: 8.7 10*3/uL (ref 4.0–10.5)
nRBC: 0.6 % — ABNORMAL HIGH (ref 0.0–0.2)

## 2021-04-19 LAB — SURGICAL PATHOLOGY

## 2021-04-19 MED ORDER — PANTOPRAZOLE SODIUM 40 MG PO TBEC
40.0000 mg | DELAYED_RELEASE_TABLET | Freq: Every day | ORAL | 1 refills | Status: DC
  Filled 2021-04-19: qty 30, 30d supply, fill #0

## 2021-04-19 NOTE — TOC Progression Note (Signed)
Transition of Care [facility]) - Progression Note    Patient Details  Name: Joan [name] MRN: [id number] Date of Birth: 1966-01-15  Transition of Care [facility]) CM/SW Contact  Beckie Busing, RN Phone Number:[phone]  04/19/2021, 2:30 PM  Clinical Narrative:    MATCH completed to provide uninsured patient with 30 day supply of home meds.         Expected Discharge Plan and Services           Expected Discharge Date: 04/19/21                                     Social Determinants of Health (SDOH) Interventions    Readmission Risk Interventions No flowsheet data found.

## 2021-04-19 NOTE — Plan of Care (Signed)

## 2021-04-19 NOTE — Progress Notes (Signed)
[facility] ROUNDING NOTE   Subjective: No acute events overnight.  Patient's hemoglobin drifted slightly overnight but overall stable.  No e/o GI bleeding, no GI symptoms.    Objective: Vital signs in last 24 hours: Temp:  [97.6 F (36.4 C)-98.5 F (36.9 C)] 97.6 F (36.4 C) (10/27 1219) Pulse Rate:  [68-75] 75 (10/27 1219) Resp:  [17-18] 17 (10/27 1219) BP: (110-132)/(66-76) 132/76 (10/27 1219) SpO2:  [91 %-97 %] 97 % (10/27 1219) Last BM Date: 04/18/21 General: NAD, pleasant Caucasian fm Lungs:  CTA b/l, no w/r/r Heart:  RRR, no m/r/g Abdomen:  Soft, NT, ND, +BS Ext:  No c/c/e    Intake/Output from previous day: 10/26 0701 - 10/27 0700 In: 1733.7 [P.O.:840; I.V.:550; IV Piggyback:293.7] Out: -  Intake/Output this shift: No intake/output data recorded.   Lab Results: Recent Labs    04/17/21 1255 04/18/21 0337 04/18/21 1237 04/19/21 0157  WBC 6.7 4.9  --  8.7  HGB 5.7* 8.4*  8.5* 8.6* 8.0*  PLT 560* 391  --  368  MCV 64.4* 70.4*  --  70.5*   BMET Recent Labs    04/17/21 1255 04/19/21 0157  NA 136 135  K 3.7 3.7  CL 103 107  CO2 22 22  GLUCOSE 107* 106*  BUN 8 7  CREATININE 1.30* 1.24*  CALCIUM 9.0 8.4*   LFT No results for input(s): PROT, ALBUMIN, AST, ALT, ALKPHOS, BILITOT, BILIDIR, IBILI in the last 72 hours. PT/INR No results for input(s): INR in the last 72 hours.    Imaging/Other results: Capsule Endoscopy:  Rapid transit through small bowel (<3hrs). Small amounts of hematin seen in stomach and duodenum related to previous biopsies.  Normal small intestine Procedure report will be scanned in at a later date    Assessment and Plan:  55 year old female admitted with severe iron deficiency (hgb 5.7, ferritin 3), negative FOBT and now with negative pan-endoscopy (EGD, colonoscopy, VCE) with no abnormalities that would cause occult GI bleeding.  Biopsies are pending for celiac disease and H. Pylori which can iron cause iron  malabsorption.  Patient does have a large hiatal hernia and takes PPI for her GERD symptoms chronically.  PPI use can be associated with impaired iron absorption as well.  She is not bleeding and thus can be discharged home.  I recommend oral iron supplementation, preferrably with vitamin C to improve absorption and following up her CBC as an outpatient.  If her iron deficiency does not improve, I would recommend discontinuing the PPI and then continued monitoring of CBC.  Iron deficiency anemia, no GI source of bleeding identified - Discharge home - Start oral iron supplementation with vitamin C or orange juice - Repeat CBC in 1 month.  If no improvement, stop PPI, use Pepcid for GERD symptoms and then repeat CBC in 1 month.  Jamye Balicki E. Tomasa Rand, MD [facility]     Jenel Lucks, DO  04/19/2021, 1:10 PM [facility] Pager [phone]

## 2021-04-19 NOTE — Discharge Summary (Addendum)
Physician Discharge Summary  AYJAH SHOW NGE:[id number] DOB: [birth year] DOA: 04/17/2021  PCP: Gwenyth Bender, MD  Admit date: 04/17/2021 Discharge date: 04/19/2021  Time spent: 60 minutes  Recommendations for Outpatient Follow-up:  Follow-up hematology in 1 week Follow-up PCP in 2 weeks Follow biopsy results for celiac disease and H. pylori as outpatient  Discharge Diagnoses:  Active Problems:   GI bleed   Anemia   Discharge Condition: Stable  Diet recommendation: Heart healthy diet  Filed Weights   04/18/21 0246 04/18/21 0841 04/18/21 1201  Weight: 60.8 kg 62.1 kg 62.1 kg    History of present illness:  55 year old female with medical history of aortic stenosis, s/p bioprosthetic valve replacement, CAD s/p CABG x1, GERD, hypertension, hyperlipidemia, anxiety, depression presented with increasing fatigue, shortness of breath for 1 week.  In the ED she was found to have a hemoglobin of 5.7, platelets 568, creatinine 1.3, BUN 18, PRBC x2 ordered in the ED.    Hospital Course:   GI bleed -Unclear etiology -Underwent EGD and colonoscopy which were unremarkable -Also underwent capsule endoscopy which was negative for bleeding -Biopsies were obtained for celiac disease and H. Pylori -Follow biopsy result and outpatient -We will discharge patient on Protonix 40 mg p.o. daily   Symptomatic anemia -Hemoglobin up to 8.0 after 2 units PRBC -Patient also received 500 mg of IV Venofer x1 -She has ferritin 3, iron saturation 2% -Called and discussed with hematology Dr. Pamelia Hoit, they will see patient as outpatient in 1 week -No p.o. iron needed as per hematology.  Patient will get IV iron as outpatient.    History of CAD -No chest pain -No acute changes on EKG -Continue aspirin   Hypertension -Blood pressure stable -   Aortic stenosis status post repair -Follow-up cardiology as outpatient    Procedures: EGD Colonoscopy Capsule  endoscopy  Consultations: Gastroenterology  Discharge Exam: Vitals:   04/19/21 0351 04/19/21 1219  BP: 128/66 132/76  Pulse: 68 75  Resp: 18 17  Temp: 98.5 F (36.9 C) 97.6 F (36.4 C)  SpO2: 91% 97%    General: Appears in no acute distress Cardiovascular:  S1-S2, regular Respiratory: Clear to auscultation bilaterally  Discharge Instructions   Discharge Instructions     Diet - low sodium heart healthy   Complete by: As directed    Increase activity slowly   Complete by: As directed       Allergies as of 04/19/2021   No Known Allergies      Medication List     STOP taking these medications    famotidine 20 MG tablet Commonly known as: PEPCID   oxyCODONE-acetaminophen 5-325 MG tablet Commonly known as: PERCOCET/ROXICET       TAKE these medications    amLODipine 10 MG tablet Commonly known as: NORVASC Take 10 mg by mouth daily.   aspirin 325 MG tablet Take 81 mg by mouth daily.   lisinopril 2.5 MG tablet Commonly known as: ZESTRIL Take 2.5 mg by mouth daily.   pantoprazole 40 MG tablet Commonly known as: Protonix Take 1 tablet (40 mg total) by mouth daily.       No Known Allergies  Follow-up Information     Serena Croissant, MD. Schedule an appointment as soon as possible for a visit in 1 week(s).   Specialty: Hematology and Oncology Contact information: [address] [place] [address] [phone]         Gwenyth Bender, MD Follow up in 2 week(s).  Specialty: Internal Medicine Contact information: [address] [place] [address] [phone]                  The results of significant diagnostics from this hospitalization (including imaging, microbiology, ancillary and laboratory) are listed below for reference.    Significant Diagnostic Studies: DG Chest 2 View  Result Date: 04/17/2021 CLINICAL DATA:  Shortness of breath EXAM: CHEST - 2 VIEW COMPARISON:  Chest x-ray 05/06/2015  FINDINGS: Heart is mildly enlarged. Mediastinum within normal limits. Median sternotomy wires. Pulmonary vasculature appears normal. No focal consolidation identified. No pleural effusion or pneumothorax. Moderate to large hiatal hernia. IMPRESSION: 1. No acute intrathoracic process identified. 2. Mild cardiomegaly. 3. Hiatal hernia. Electronically Signed   By: Jannifer Hick   On: 04/17/2021 13:22    Microbiology: Recent Results (from the past 240 hour(s))  Resp Panel by RT-PCR (Flu A&B, Covid) Nasopharyngeal Swab     Status: None   Collection Time: 04/17/21  1:24 PM   Specimen: Nasopharyngeal Swab; Nasopharyngeal(NP) swabs in vial transport medium  Result Value Ref Range Status   SARS Coronavirus 2 by RT PCR NEGATIVE NEGATIVE Final    Comment: (NOTE) SARS-CoV-2 target nucleic acids are NOT DETECTED.  The SARS-CoV-2 RNA is generally detectable in upper respiratory specimens during the acute phase of infection. The lowest concentration of SARS-CoV-2 viral copies this assay can detect is 138 copies/mL. A negative result does not preclude SARS-Cov-2 infection and should not be used as the sole basis for treatment or other patient management decisions. A negative result may occur with  improper specimen collection/handling, submission of specimen other than nasopharyngeal swab, presence of viral mutation(s) within the areas targeted by this assay, and inadequate number of viral copies(<138 copies/mL). A negative result must be combined with clinical observations, patient history, and epidemiological information. The expected result is Negative.  Fact Sheet for Patients:  BloggerCourse.com  Fact Sheet for Healthcare Providers:  SeriousBroker.it  This test is no t yet approved or cleared by the [place] FDA and  has been authorized for detection and/or diagnosis of SARS-CoV-2 by FDA under an Emergency Use Authorization (EUA). This  EUA will remain  in effect (meaning this test can be used) for the duration of the COVID-19 declaration under Section 564(b)(1) of the Act, 21 U.S.C.section 360bbb-3(b)(1), unless the authorization is terminated  or revoked sooner.       Influenza A by PCR NEGATIVE NEGATIVE Final   Influenza B by PCR NEGATIVE NEGATIVE Final    Comment: (NOTE) The Xpert Xpress SARS-CoV-2/FLU/RSV plus assay is intended as an aid in the diagnosis of influenza from Nasopharyngeal swab specimens and should not be used as a sole basis for treatment. Nasal washings and aspirates are unacceptable for Xpert Xpress SARS-CoV-2/FLU/RSV testing.  Fact Sheet for Patients: BloggerCourse.com  Fact Sheet for Healthcare Providers: SeriousBroker.it  This test is not yet approved or cleared by the [place] FDA and has been authorized for detection and/or diagnosis of SARS-CoV-2 by FDA under an Emergency Use Authorization (EUA). This EUA will remain in effect (meaning this test can be used) for the duration of the COVID-19 declaration under Section 564(b)(1) of the Act, 21 U.S.C. section 360bbb-3(b)(1), unless the authorization is terminated or revoked.  Performed at [facility], [address] [place], [place] [address]      Labs: Basic Metabolic Panel: Recent Labs  Lab 04/17/21 1255 04/19/21 0157  NA 136 135  K 3.7 3.7  CL  103 107  CO2 22 22  GLUCOSE 107* 106*  BUN 8 7  CREATININE 1.30* 1.24*  CALCIUM 9.0 8.4*   Liver Function Tests: No results for input(s): AST, ALT, ALKPHOS, BILITOT, PROT, ALBUMIN in the last 168 hours. No results for input(s): LIPASE, AMYLASE in the last 168 hours. No results for input(s): AMMONIA in the last 168 hours. CBC: Recent Labs  Lab 04/17/21 1255 04/18/21 0337 04/18/21 1237 04/19/21 0157  WBC 6.7 4.9  --  8.7  NEUTROABS 3.6 2.4  --   --   HGB 5.7* 8.4*  8.5* 8.6* 8.0*  HCT 21.3* 28.3* 29.6* 27.5*   MCV 64.4* 70.4*  --  70.5*  PLT 560* 391  --  368        Signed:  Meredeth Ide MD.  [facility] 04/19/2021, 2:06 PM

## 2021-04-19 NOTE — Telephone Encounter (Signed)
Scheduled per sch msg. Called and spoke with patient. Confirmed appt  

## 2021-04-19 NOTE — Progress Notes (Signed)
Received a phone call from Dr. Sharl Ma regarding this patient being discharged home with profound iron deficiency. We will see her next week and set her up for additional dose of IV iron.

## 2021-04-20 ENCOUNTER — Encounter: Payer: Self-pay | Admitting: Gastroenterology

## 2021-04-23 ENCOUNTER — Telehealth: Payer: Self-pay

## 2021-04-23 NOTE — Assessment & Plan Note (Signed)
IDA due to GI Bleed: EGD: unremarkable, Capsule endoscopy Neg,   Hospitalization: 04/17/21-04/19/21 Biopsies for H.Pylori: Neg, No celiac changes S/P 1 dose of IV Iron (500 mg dose) We will set up for 2 more doses

## 2021-04-23 NOTE — Progress Notes (Signed)
[facility] CONSULT NOTE  Patient Care Team: Gwenyth Bender, MD as PCP - General (Internal Medicine)  CHIEF COMPLAINTS/PURPOSE OF CONSULTATION:  Newly diagnosed iron deficiency anemia  HISTORY OF PRESENTING ILLNESS:  Joan [name] 55 y.o. female is here because of recent diagnosis of iron deficiency anemia.  She was admitted to the hospital on 04/17/2021 with complaints of shortness of breath and fatigue.  She was found to have a hemoglobin of 5.7 and 2 units of PRBC was given along with a 500 mg of Venofer.  Iron studies showed profound iron deficiency with a ferritin of 3.  She underwent upper endoscopy colonoscopy and capsule endoscopy all of which were negative for bleeding.  Biopsies were negative for celiac disease and H. pylori as well.  She was referred to [place] for additional IV iron treatments after she did got discharged from the hospital.  I reviewed her records extensively and collaborated the history with the patient.     MEDICAL HISTORY:  Past Medical History:  Diagnosis Date   Coronary artery disease    Hypertension     SURGICAL HISTORY: Past Surgical History:  Procedure Laterality Date   AORTA SURGERY     BIOPSY  04/18/2021   Procedure: BIOPSY;  Surgeon: Jenel Lucks, MD;  Location: [facility] ENDOSCOPY;  Service: Gastroenterology;;   COLONOSCOPY WITH PROPOFOL N/A 04/18/2021   Procedure: COLONOSCOPY WITH PROPOFOL;  Surgeon: Jenel Lucks, MD;  Location: [facility] ENDOSCOPY;  Service: Gastroenterology;  Laterality: N/A;   ESOPHAGOGASTRODUODENOSCOPY (EGD) WITH PROPOFOL N/A 04/18/2021   Procedure: ESOPHAGOGASTRODUODENOSCOPY (EGD) WITH PROPOFOL;  Surgeon: Jenel Lucks, MD;  Location: [facility] ENDOSCOPY;  Service: Gastroenterology;  Laterality: N/A;   GIVENS CAPSULE STUDY N/A 04/18/2021   Procedure: GIVENS CAPSULE STUDY;  Surgeon: Jenel Lucks, MD;  Location: [facility] ENDOSCOPY;  Service: Gastroenterology;  Laterality: N/A;    SOCIAL HISTORY: Social  History   Socioeconomic History   Marital status: Single    Spouse name: Not on file   Number of children: Not on file   Years of education: Not on file   Highest education level: Not on file  Occupational History   Not on file  Tobacco Use   Smoking status: Never   Smokeless tobacco: Never  Substance and Sexual Activity   Alcohol use: No   Drug use: No   Sexual activity: Not on file  Other Topics Concern   Not on file  Social History Narrative   Not on file   Social Determinants of Health   Financial Resource Strain: Not on file  Food Insecurity: Not on file  Transportation Needs: Not on file  Physical Activity: Not on file  Stress: Not on file  Social Connections: Not on file  Intimate Partner Violence: Not on file    FAMILY HISTORY: No family history on file.  ALLERGIES:  has No Known Allergies.  MEDICATIONS:  Current Outpatient Medications  Medication Sig Dispense Refill   amLODipine (NORVASC) 10 MG tablet Take 10 mg by mouth daily.       aspirin 325 MG tablet Take 81 mg by mouth daily.     lisinopril (PRINIVIL,ZESTRIL) 2.5 MG tablet Take 2.5 mg by mouth daily.       pantoprazole (PROTONIX) 40 MG tablet Take 1 tablet (40 mg total) by mouth daily. 30 tablet 1   No current facility-administered medications for this visit.    REVIEW OF SYSTEMS:   Constitutional: Denies fevers, chills or abnormal night sweats Eyes: Denies  blurriness of vision, double vision or watery eyes Ears, nose, mouth, throat, and face: Denies mucositis or sore throat Respiratory: Denies cough, dyspnea or wheezes Cardiovascular: Denies palpitation, chest discomfort or lower extremity swelling Gastrointestinal:  Denies nausea, heartburn or change in bowel habits Skin: Denies abnormal skin rashes Lymphatics: Denies new lymphadenopathy or easy bruising Neurological:Denies numbness, tingling or new weaknesses Behavioral/Psych: Mood is stable, no new changes    All other systems were  reviewed with the patient and are negative.  PHYSICAL EXAMINATION: ECOG PERFORMANCE STATUS: 1 - Symptomatic but completely ambulatory  Vitals:   04/24/21 1318  BP: 139/85  Pulse: 82  Resp: 18  Temp: (!) 97.3 F (36.3 C)  SpO2: 97%   Filed Weights   04/24/21 1318  Weight: 139 lb 3.2 oz (63.1 kg)      LABORATORY DATA:  I have reviewed the data as listed Lab Results  Component Value Date   WBC 8.7 04/19/2021   HGB 8.0 (L) 04/19/2021   HCT 27.5 (L) 04/19/2021   MCV 70.5 (L) 04/19/2021   PLT 368 04/19/2021   Lab Results  Component Value Date   NA 135 04/19/2021   K 3.7 04/19/2021   CL 107 04/19/2021   CO2 22 04/19/2021    RADIOGRAPHIC STUDIES: I have personally reviewed the radiological reports and agreed with the findings in the report.  ASSESSMENT AND PLAN:  Iron deficiency anemia IDA due to GI Bleed: EGD: unremarkable, Capsule endoscopy Neg,   Hospitalization: 04/17/21-04/19/21 Biopsies for H.Pylori: Neg, No celiac changes  I discussed with her about the mechanism of iron absorption.  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  I discussed with her that she might need additional dose of IV iron therapy depending on where her levels are down the line.  S/P 1 dose of IV Iron (500 mg dose) We will set up for 2 more doses Return to clinic in 6 weeks for labs and follow-up  All questions were answered. The patient knows to call the clinic with any problems, questions or concerns.    Tamsen Meek, MD 04/24/21

## 2021-04-23 NOTE — Telephone Encounter (Signed)
Per result note pt is to come in for repeat labs in 1 month. What labs do you want drawn so that the orders can be placed in epic. Please advise.

## 2021-04-24 ENCOUNTER — Inpatient Hospital Stay: Payer: Self-pay | Attending: Hematology and Oncology | Admitting: Hematology and Oncology

## 2021-04-24 ENCOUNTER — Other Ambulatory Visit: Payer: Self-pay

## 2021-04-24 ENCOUNTER — Inpatient Hospital Stay: Payer: Self-pay

## 2021-04-24 VITALS — BP 143/74 | HR 78

## 2021-04-24 DIAGNOSIS — R0602 Shortness of breath: Secondary | ICD-10-CM | POA: Insufficient documentation

## 2021-04-24 DIAGNOSIS — R5383 Other fatigue: Secondary | ICD-10-CM | POA: Insufficient documentation

## 2021-04-24 DIAGNOSIS — Z79899 Other long term (current) drug therapy: Secondary | ICD-10-CM | POA: Insufficient documentation

## 2021-04-24 DIAGNOSIS — I2510 Atherosclerotic heart disease of native coronary artery without angina pectoris: Secondary | ICD-10-CM | POA: Insufficient documentation

## 2021-04-24 DIAGNOSIS — I10 Essential (primary) hypertension: Secondary | ICD-10-CM | POA: Insufficient documentation

## 2021-04-24 DIAGNOSIS — D509 Iron deficiency anemia, unspecified: Secondary | ICD-10-CM

## 2021-04-24 DIAGNOSIS — K922 Gastrointestinal hemorrhage, unspecified: Secondary | ICD-10-CM | POA: Insufficient documentation

## 2021-04-24 MED ORDER — SODIUM CHLORIDE 0.9 % IV SOLN
300.0000 mg | Freq: Once | INTRAVENOUS | Status: AC
  Administered 2021-04-24: 300 mg via INTRAVENOUS
  Filled 2021-04-24: qty 300

## 2021-04-24 MED ORDER — SODIUM CHLORIDE 0.9 % IV SOLN
Freq: Once | INTRAVENOUS | Status: AC
Start: 2021-04-24 — End: 2021-04-24

## 2021-04-24 NOTE — Patient Instructions (Signed)

## 2021-04-24 NOTE — Progress Notes (Signed)
When placing the pt's IV today, this RN and another RN noticed a spot on her left arm that appears to have been an infiltrated IV. The spot is red, circular, and the area is hardened.  Both RN's also noticed a place on the pt's right arm that was reddened as well.  Upon further assessment the vein is hard to the touch. The affected vein is distal to her [facility] and is hardended approximately 3 inches in length. The pt complained of discomfort in the area.  RN notifed MD.

## 2021-04-25 ENCOUNTER — Other Ambulatory Visit: Payer: Self-pay

## 2021-04-25 DIAGNOSIS — K922 Gastrointestinal hemorrhage, unspecified: Secondary | ICD-10-CM

## 2021-04-25 NOTE — Telephone Encounter (Signed)
Order and reminder in epic.

## 2021-04-25 NOTE — Progress Notes (Signed)
..  Patient is receiving Replacement Medication. Medication: Venofer (iron sucrose) Manufacture: [employer] Approval Dates: Approved from 04/25/2021 until 04/24/2022. ID: [id number] Reason: Self Pay First DOS: 04/24/2021.  Marland KitchenDarlyne Russian, CPhT IV Drug Replacement Specialist [facility] Phone: [phone]

## 2021-04-30 NOTE — Progress Notes (Signed)
ERROR

## 2021-05-01 ENCOUNTER — Other Ambulatory Visit: Payer: Self-pay

## 2021-05-01 ENCOUNTER — Inpatient Hospital Stay: Payer: Self-pay

## 2021-05-01 VITALS — BP 141/90 | HR 66 | Temp 97.9°F | Resp 18

## 2021-05-01 DIAGNOSIS — D509 Iron deficiency anemia, unspecified: Secondary | ICD-10-CM

## 2021-05-01 MED ORDER — SODIUM CHLORIDE 0.9 % IV SOLN: Freq: Once | INTRAVENOUS | Status: AC

## 2021-05-01 MED ORDER — SODIUM CHLORIDE 0.9 % IV SOLN
300.0000 mg | Freq: Once | INTRAVENOUS | Status: AC
  Administered 2021-05-01: 300 mg via INTRAVENOUS
  Filled 2021-05-01: qty 300

## 2021-05-01 NOTE — Patient Instructions (Signed)

## 2021-05-22 ENCOUNTER — Ambulatory Visit: Payer: Self-pay | Admitting: Internal Medicine

## 2021-06-04 ENCOUNTER — Ambulatory Visit: Payer: Self-pay | Admitting: Family Medicine

## 2021-06-04 NOTE — Progress Notes (Signed)
Patient Care Team: Gwenyth Bender, MD as PCP - General (Internal Medicine)  DIAGNOSIS:    ICD-10-CM   1. Iron deficiency anemia, unspecified iron deficiency anemia type  D50.9       CHIEF COMPLIANT: Follow-up of IDA  INTERVAL HISTORY: Joan [name] is a 55 y.o. with above-mentioned history of IDA. She presents to the clinic today for follow-up and to review her recent labs.  Patient felt a remarkable improvement in her energy levels.  She did not have any side effects to IV iron therapy.  ALLERGIES:  has No Known Allergies.  MEDICATIONS:  Current Outpatient Medications  Medication Sig Dispense Refill   amLODipine (NORVASC) 10 MG tablet Take 10 mg by mouth daily.       aspirin 325 MG tablet Take 81 mg by mouth daily.     lisinopril (PRINIVIL,ZESTRIL) 2.5 MG tablet Take 2.5 mg by mouth daily.       pantoprazole (PROTONIX) 40 MG tablet Take 1 tablet (40 mg total) by mouth daily. 30 tablet 1   sertraline (ZOLOFT) 100 MG tablet Take 100 mg by mouth daily.     traZODone (DESYREL) 50 MG tablet Take 50 mg by mouth at bedtime.     No current facility-administered medications for this visit.    PHYSICAL EXAMINATION: ECOG PERFORMANCE STATUS: 1 - Symptomatic but completely ambulatory  Vitals:   06/05/21 1429  BP: (!) 156/88  Pulse: 79  Resp: 18  Temp: 97.9 F (36.6 C)  SpO2: 99%   Filed Weights   06/05/21 1429  Weight: 134 lb 8 oz (61 kg)    LABORATORY DATA:  I have reviewed the data as listed CMP Latest Ref Rng & Units 04/19/2021 04/17/2021 05/06/2015  Glucose 70 - 99 mg/dL 185(U) 314(H) 70  BUN 6 - 20 mg/dL 7 8 11   Creatinine 0.44 - 1.00 mg/dL ) 7.02(O) 3.78(H  Sodium 135 - 145 mmol/L 135 136 137  Potassium 3.5 - 5.1 mmol/L 3.7 3.7 3.9  Chloride 98 - 111 mmol/L 107 103 102  CO2 22 - 32 mmol/L 22 22 25   Calcium 8.9 - 10.3 mg/dL 8.85) 9.0 8.9  Total Protein 6.0 - 8.3 g/dL - - -  Total Bilirubin 0.3 - 1.2 mg/dL - - -  Alkaline Phos 39 - 117 U/L - - -  AST 0 -  37 U/L - - -  ALT 0 - 35 U/L - - -    Lab Results  Component Value Date   WBC 5.1 06/05/2021   HGB 14.4 06/05/2021   HCT 45.6 06/05/2021   MCV 87.0 06/05/2021   PLT 344 06/05/2021   NEUTROABS 2.0 06/05/2021    ASSESSMENT & PLAN:  Iron deficiency anemia Iron deficiency anemia IDA due to GI Bleed: EGD: unremarkable, Capsule endoscopy Neg,   Hospitalization: 04/17/21-04/19/21 Biopsies for H.Pylori: Neg, No celiac changes   I discussed with her about the mechanism of iron absorption.  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  I discussed with her that she might need additional dose of IV iron therapy depending on where her levels are down the line.   IV iron: November 2022 Lab review: 04/17/2021: Hemoglobin 5.7 (hospitalization)  06/05/2021: Hemoglobin 14.4   Return to clinic in 3 months for labs and follow-up    No orders of the defined types were placed in this encounter.  The patient has a good understanding of the overall plan. she agrees with it. she will call with any problems  that may develop before the next visit here.  Total time spent: 20 mins including face to face time and time spent for planning, charting and coordination of care  Sabas Sous, MD, MPH 06/05/2021  I, Alda Ponder, am acting as scribe for Dr. Serena Croissant.  I have reviewed the above documentation for accuracy and completeness, and I agree with the above.

## 2021-06-04 NOTE — Assessment & Plan Note (Signed)
Iron deficiency anemia IDA due to GI Bleed: EGD: unremarkable, Capsule endoscopy Neg,   Hospitalization: 04/17/21-04/19/21 Biopsies for H.Pylori: Neg, No celiac changes  I discussed with her about the mechanism of iron absorption.  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  I discussed with her that she might need additional dose of IV iron therapy depending on where her levels are down the line.  S/P 1 dose of IV Iron (500 mg dose) We will set up for 2 more doses Return to clinic in 6 weeks for labs and follow-up

## 2021-06-05 ENCOUNTER — Inpatient Hospital Stay (HOSPITAL_BASED_OUTPATIENT_CLINIC_OR_DEPARTMENT_OTHER): Payer: Self-pay | Admitting: Hematology and Oncology

## 2021-06-05 ENCOUNTER — Other Ambulatory Visit: Payer: Self-pay

## 2021-06-05 ENCOUNTER — Inpatient Hospital Stay: Payer: Self-pay | Attending: Hematology and Oncology

## 2021-06-05 DIAGNOSIS — D509 Iron deficiency anemia, unspecified: Secondary | ICD-10-CM | POA: Insufficient documentation

## 2021-06-05 DIAGNOSIS — Z79899 Other long term (current) drug therapy: Secondary | ICD-10-CM | POA: Insufficient documentation

## 2021-06-05 DIAGNOSIS — Z7982 Long term (current) use of aspirin: Secondary | ICD-10-CM | POA: Insufficient documentation

## 2021-06-05 LAB — CBC WITH DIFFERENTIAL (CANCER CENTER ONLY)
Abs Immature Granulocytes: 0.01 10*3/uL (ref 0.00–0.07)
Basophils Absolute: 0 10*3/uL (ref 0.0–0.1)
Basophils Relative: 1 %
Eosinophils Absolute: 0.5 10*3/uL (ref 0.0–0.5)
Eosinophils Relative: 10 %
HCT: 45.6 % (ref 36.0–46.0)
Hemoglobin: 14.4 g/dL (ref 12.0–15.0)
Immature Granulocytes: 0 %
Lymphocytes Relative: 40 %
Lymphs Abs: 2 10*3/uL (ref 0.7–4.0)
MCH: 27.5 pg (ref 26.0–34.0)
MCHC: 31.6 g/dL (ref 30.0–36.0)
MCV: 87 fL (ref 80.0–100.0)
Monocytes Absolute: 0.5 10*3/uL (ref 0.1–1.0)
Monocytes Relative: 9 %
Neutro Abs: 2 10*3/uL (ref 1.7–7.7)
Neutrophils Relative %: 40 %
Platelet Count: 344 10*3/uL (ref 150–400)
RBC: 5.24 MIL/uL — ABNORMAL HIGH (ref 3.87–5.11)
RDW: 24.6 % — ABNORMAL HIGH (ref 11.5–15.5)
WBC Count: 5.1 10*3/uL (ref 4.0–10.5)
nRBC: 0 % (ref 0.0–0.2)

## 2021-06-05 LAB — IRON AND TIBC
Iron: 63 ug/dL (ref 41–142)
Saturation Ratios: 17 % — ABNORMAL LOW (ref 21–57)
TIBC: 373 ug/dL (ref 236–444)
UIBC: 310 ug/dL (ref 120–384)

## 2021-06-05 LAB — FERRITIN: Ferritin: 47 ng/mL (ref 11–307)

## 2021-06-29 ENCOUNTER — Ambulatory Visit: Payer: Self-pay | Admitting: Internal Medicine

## 2021-07-03 ENCOUNTER — Ambulatory Visit (INDEPENDENT_AMBULATORY_CARE_PROVIDER_SITE_OTHER): Payer: Self-pay | Admitting: Family Medicine

## 2021-07-03 ENCOUNTER — Other Ambulatory Visit: Payer: Self-pay

## 2021-07-03 ENCOUNTER — Encounter: Payer: Self-pay | Admitting: Family Medicine

## 2021-07-03 DIAGNOSIS — R87810 Cervical high risk human papillomavirus (HPV) DNA test positive: Secondary | ICD-10-CM | POA: Insufficient documentation

## 2021-07-03 NOTE — Patient Instructions (Addendum)
BCCCP (Breast and Cervical Cancer Control Program)  [phone]   

## 2021-07-03 NOTE — Assessment & Plan Note (Signed)
Per review of records only one pap result available, NILM with +HRHPV on 04/17/21. Per review of ASCCP algorithm colpo not indicated, recommend follow up co-testing in one year. Discussed with patient etiology of cervical dysplasia with primary cause being HPV, that a majority of women will clear this infection within one year, and that for this reason we often do closer interval follow up as in this case.   Patient uninsured so was referred to [facility] for further pap smears. Also strongly recommended she call them to coordinate a mammogram as she is due.

## 2021-07-03 NOTE — Progress Notes (Signed)
GYNECOLOGY OFFICE VISIT NOTE  History:   Joan [name] is a 56 y.o. No obstetric history on file. Referred for colposcopy.  Patient reports remote hx of abnormal paps with "freezing" of the cervix, but none recently until this past October Patient had normal cytology with positive HPV (non-typed) No recent mammogram  Health Maintenance Due  Topic Date Due   COVID-19 Vaccine (1) Never done   Hepatitis C Screening  Never done   MAMMOGRAM  Never done   Zoster Vaccines- Shingrix (1 of 2) Never done    Past Medical History:  Diagnosis Date   Anxiety    Arm pain    Coronary artery disease    Dizziness    GERD (gastroesophageal reflux disease)    History of aortic dissection    HPV (human papilloma virus) infection    Hyperlipidemia    Hypertension    Insomnia    Joint pain, knee    Substance abuse (HCC)     Past Surgical History:  Procedure Laterality Date   AORTA SURGERY     BIOPSY  04/18/2021   Procedure: BIOPSY;  Surgeon: Jenel Lucks, MD;  Location: [facility] ENDOSCOPY;  Service: Gastroenterology;;   COLONOSCOPY WITH PROPOFOL N/A 04/18/2021   Procedure: COLONOSCOPY WITH PROPOFOL;  Surgeon: Jenel Lucks, MD;  Location: [facility] ENDOSCOPY;  Service: Gastroenterology;  Laterality: N/A;   ESOPHAGOGASTRODUODENOSCOPY (EGD) WITH PROPOFOL N/A 04/18/2021   Procedure: ESOPHAGOGASTRODUODENOSCOPY (EGD) WITH PROPOFOL;  Surgeon: Jenel Lucks, MD;  Location: [facility] ENDOSCOPY;  Service: Gastroenterology;  Laterality: N/A;   GIVENS CAPSULE STUDY N/A 04/18/2021   Procedure: GIVENS CAPSULE STUDY;  Surgeon: Jenel Lucks, MD;  Location: [facility] ENDOSCOPY;  Service: Gastroenterology;  Laterality: N/A;    The following portions of the patient's history were reviewed and updated as appropriate: allergies, current medications, past family history, past medical history, past social history, past surgical history and problem list.   Health Maintenance:   Last pap: No results found  for: DIAGPAP, HPV, HPVHIGH  In media tab referral: 04/17/21 had NILM, +HRHPV pap  Last mammogram:  None on file    Review of Systems:  Pertinent items noted in HPI and remainder of comprehensive ROS otherwise negative.  Physical Exam:  BP (!) 150/100    Pulse 76    Wt 133 lb 9.6 oz (60.6 kg)    BMI 22.93 kg/m  CONSTITUTIONAL: Well-developed, well-nourished female in no acute distress.  HEENT:  Normocephalic, atraumatic. External right and left ear normal. No scleral icterus.  NECK: Normal range of motion, supple, no masses noted on observation SKIN: No rash noted. Not diaphoretic. No erythema. No pallor. MUSCULOSKELETAL: Normal range of motion. No edema noted. NEUROLOGIC: Alert and oriented to person, place, and time. Normal muscle tone coordination.  PSYCHIATRIC: Normal mood and affect. Normal behavior. Normal judgment and thought content. RESPIRATORY: Effort normal, no problems with respiration noted  Labs and Imaging No results found for this or any previous visit (from the past 168 hour(s)). No results found.    Assessment and Plan:   Problem List Items Addressed This Visit       Other   Pap smear of cervix shows high risk HPV present    Per review of records only one pap result available, NILM with +HRHPV on 04/17/21. Per review of ASCCP algorithm colpo not indicated, recommend follow up co-testing in one year. Discussed with patient etiology of cervical dysplasia with primary cause being HPV, that a majority of women will clear  this infection within one year, and that for this reason we often do closer interval follow up as in this case.   Patient uninsured so was referred to [facility] for further pap smears. Also strongly recommended she call them to coordinate a mammogram as she is due.        Routine preventative health maintenance measures emphasized. Please refer to After Visit Summary for other counseling recommendations.   Return if symptoms worsen or fail to  improve.    Total face-to-face time with patient: 20 minutes.  Over 50% of encounter was spent on counseling and coordination of care.   Venora Maples, MD/MPH Attending Family Medicine Physician, [facility] for [facility], [facility]

## 2021-08-30 ENCOUNTER — Ambulatory Visit (INDEPENDENT_AMBULATORY_CARE_PROVIDER_SITE_OTHER): Payer: No Typology Code available for payment source | Admitting: Internal Medicine

## 2021-08-30 ENCOUNTER — Encounter: Payer: Self-pay | Admitting: Hematology and Oncology

## 2021-08-30 ENCOUNTER — Other Ambulatory Visit: Payer: Self-pay

## 2021-08-30 ENCOUNTER — Encounter: Payer: Self-pay | Admitting: Internal Medicine

## 2021-08-30 VITALS — BP 113/76 | HR 72 | Ht 64.0 in | Wt 138.6 lb

## 2021-08-30 DIAGNOSIS — Z951 Presence of aortocoronary bypass graft: Secondary | ICD-10-CM

## 2021-08-30 NOTE — Patient Instructions (Addendum)
Medication Instructions:  ?No Changes In Medications at this time.  ?*If you need a refill on your cardiac medications before your next appointment, please call your pharmacy* ? ?Please return for Blood Work in 1 WEEK. No appointment needed, lab here at the office is open Monday-Friday from 8AM to 4PM and closed daily for lunch from 12:45-1:45. YOU WILL NEED TO FAST  ? ?Testing/Procedures: ?Your physician has requested that you have an echocardiogram. Echocardiography is a painless test that uses sound waves to create images of your heart. It provides your doctor with information about the size and shape of your heart and how well your heart?s chambers and valves are working. You may receive an ultrasound enhancing agent through an IV if needed to better visualize your heart during the echo.This procedure takes approximately one hour. There are no restrictions for this procedure. This will take place at the [address].  ? ?Follow-Up: ?At [facility], you and your health needs are our priority.  As part of our continuing mission to provide you with exceptional heart care, we have created designated Provider Care Teams.  These Care Teams include your primary Cardiologist (physician) and Advanced Practice Providers (APPs -  Physician Assistants and Nurse Practitioners) who all work together to provide you with the care you need, when you need it. ? ?We recommend signing up for the patient portal called "MyChart".  Sign up information is provided on this After Visit Summary.  MyChart is used to connect with patients for Virtual Visits (Telemedicine).  Patients are able to view lab/test results, encounter notes, upcoming appointments, etc.  Non-urgent messages can be sent to your provider as well.   ?To learn more about what you can do with MyChart, go to [email or web address].   ? ?Your next appointment:   ?6 month(s) ? ?The format for your next appointment:   ?In Person ? ?Provider:   ?Maisie Fus, MD   ? ?

## 2021-08-30 NOTE — Progress Notes (Signed)
?Cardiology Office Note:   ? ?Date:  08/30/2021  ? ?ID:  Joan [name], DOB 02-20-1966, MRN [id number] ? ?PCP:  Rogers Blocker, MD ?  ?[facility] Providers ?Cardiologist:  Janina Mayo, MD    ? ?Referring MD: Carmine Savoy, NP  ? ?No chief complaint on file. ?Establish care ? ?History of Present Illness:   ? ?Joan [name] is a 56 y.o. 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 ?She notes some shortness of breath from the mailbox and back; there's a hill. This is chronic. She notes feet and ankle swelling improved when legs are up. Sleeps on 2 pillows. That's chronic as well. She denies PND. No significant weight gain. No signs of bleeding. She was seen in the hospital with SOB in October and hgb 5.7 mg/dL. Her GI w/u was negative ? ?She has hx of  type 1 aortic dissection s/p repair by Dr. Roxan Hockey in August 2010.This was repaired with 8-mm Hemashield graft. She presented with hypertensive emergency. In the chart she is noted to have bioprosthetic AVR and 1v CABG. The only surgery she notes was in 2010. ? ? ?04/18/2021- sinus tachycardia, RBBB ? ?Past Medical History:  ?Diagnosis Date  ? Anxiety   ? Arm pain   ? Coronary artery disease   ? Dizziness   ? GERD (gastroesophageal reflux disease)   ? History of aortic dissection   ? HPV (human papilloma virus) infection   ? Hyperlipidemia   ? Hypertension   ? Insomnia   ? Joint pain, knee   ? Substance abuse ([place])   ? ? ?Past Surgical History:  ?Procedure Laterality Date  ? AORTA SURGERY    ? BIOPSY  04/18/2021  ? Procedure: BIOPSY;  Surgeon: Daryel November, MD;  Location: [place];  Service: Gastroenterology;;  ? COLONOSCOPY WITH PROPOFOL N/A 04/18/2021  ? Procedure: COLONOSCOPY WITH PROPOFOL;  Surgeon: Daryel November, MD;  Location: [place];  Service: Gastroenterology;  Laterality: N/A;  ?  ESOPHAGOGASTRODUODENOSCOPY (EGD) WITH PROPOFOL N/A 04/18/2021  ? Procedure: ESOPHAGOGASTRODUODENOSCOPY (EGD) WITH PROPOFOL;  Surgeon: Daryel November, MD;  Location: [place];  Service: Gastroenterology;  Laterality: N/A;  ? GIVENS CAPSULE STUDY N/A 04/18/2021  ? Procedure: GIVENS CAPSULE STUDY;  Surgeon: Daryel November, MD;  Location: [place];  Service: Gastroenterology;  Laterality: N/A;  ? ? ?Current Medications: ?Current Outpatient Medications on File Prior to Visit  ?Medication Sig Dispense Refill  ? amLODipine (NORVASC) 10 MG tablet Take 10 mg by mouth daily.      ? aspirin 325 MG tablet Take 81 mg by mouth daily.    ? lisinopril (PRINIVIL,ZESTRIL) 2.5 MG tablet Take 2.5 mg by mouth daily.    ? pantoprazole (PROTONIX) 40 MG tablet Take 1 tablet (40 mg total) by mouth daily. 30 tablet 1  ? sertraline (ZOLOFT) 100 MG tablet Take 100 mg by mouth daily.    ? ?No current facility-administered medications on file prior to visit.  ? ? ?Allergies:   Patient has no known allergies.  ? ?Social History  ? ?Socioeconomic History  ? Marital status: Single  ?  Spouse name: Not on file  ? Number of children: Not on file  ? Years of education: Not on file  ? Highest education level: Not on file  ?Occupational History  ? Not on file  ?Tobacco Use  ?  Smoking status: Never  ? Smokeless tobacco: Never  ?Substance and Sexual Activity  ? Alcohol use: No  ? Drug use: No  ? Sexual activity: Not on file  ?Other Topics Concern  ? Not on file  ?Social History Narrative  ? Not on file  ? ?Social Determinants of Health  ? ?Financial Resource Strain: Not on file  ?Food Insecurity: Not on file  ?Transportation Needs: Not on file  ?Physical Activity: Not on file  ?Stress: Not on file  ?Social Connections: Not on file  ?  ? ?Family History: ?The patient's mother died of MI at age 45 ? ?ROS:   ?Please see the history of present illness.    ? All other systems reviewed and are negative. ? ?EKGs/Labs/Other Studies Reviewed:    ? ?The following studies were reviewed today: ? ? ?EKG:  EKG is  ordered today.  The ekg ordered today demonstrates  ? ?NSR, RBBB ? ?Recent Labs: ?04/19/2021: BUN 7; Creatinine, Ser 1.24; Potassium 3.7; Sodium 135 ?06/05/2021: Hemoglobin 14.4; Platelet Count 344  ?Recent Lipid Panel ?No results found for: CHOL, TRIG, HDL, CHOLHDL, VLDL, LDLCALC, LDLDIRECT ? ? ?Risk Assessment/Calculations:   ? ? ?    ? ?Physical Exam:   ? ?VS:   ? ?Vitals:  ? 08/30/21 1416  ?BP: 113/76  ?Pulse: 72  ?SpO2: 95%  ? ? ? ?Wt Readings from Last 3 Encounters:  ?08/30/21 138 lb 9.6 oz (62.9 kg)  ?07/03/21 133 lb 9.6 oz (60.6 kg)  ?06/05/21 134 lb 8 oz (61 kg)  ?  ? ?GEN:  Well nourished, well developed in no acute distress ?HEENT: Normal ?NECK: No JVD; No carotid bruits ?LYMPHATICS: No lymphadenopathy ?CARDIAC: RRR, no murmurs, rubs, gallops ?RESPIRATORY:  Clear to auscultation without rales, wheezing or rhonchi  ?ABDOMEN: Soft, non-tender, non-distended ?MUSCULOSKELETAL:  No edema; No deformity . Median sternotomy scar ?SKIN: Warm and dry ?NEUROLOGIC:  Alert and oriented x 3 ?PSYCHIATRIC:  Normal affect  ? ?ASSESSMENT:   ? ?Type I Aortic Dissection: Only note of CT surgery op report is for type I dissection s/p arch repair 2010. Noted to be a short segment of the proximal arch. She is asymptomatic. Will plan to re-assess with an echo. ? ?CVD risk mitigation ?- Lipid profile  ? ?HTN: well controlled. Continue current regimen above. ? ? ?PLAN:   ? ?In order of problems listed above: ? ?TTE ?Lipid profile fasting ?Follow up 6 months ? ?   ? ?   ? ? ?Medication Adjustments/Labs and Tests Ordered: ?Current medicines are reviewed at length with the patient today.  Concerns regarding medicines are outlined above.  ?Orders Placed This Encounter  ?Procedures  ? Lipid panel  ? EKG 12-Lead  ? ECHOCARDIOGRAM COMPLETE  ? ?No orders of the defined types were placed in this encounter. ? ? ?Patient Instructions  ?Medication Instructions:  ?No Changes In  Medications at this time.  ?*If you need a refill on your cardiac medications before your next appointment, please call your pharmacy* ? ?Please return for Blood Work in [place]. No appointment needed, lab here at the office is open Monday-Friday from 8AM to 4PM and closed daily for lunch from 12:45-1:45. YOU WILL NEED TO FAST  ? ?Testing/Procedures: ?Your physician has requested that you have an echocardiogram. Echocardiography is a painless test that uses sound waves to create images of your heart. It provides your doctor with information about the size and shape of your heart and how well your heart?s chambers  and valves are working. You may receive an ultrasound enhancing agent through an IV if needed to better visualize your heart during the echo.This procedure takes approximately one hour. There are no restrictions for this procedure. This will take place at the [address], [address].  ? ?Follow-Up: ?At [facility], you and your health needs are our priority.  As part of our continuing mission to provide you with exceptional heart care, we have created designated Provider Care Teams.  These Care Teams include your primary Cardiologist (physician) and Advanced Practice Providers (APPs -  Physician Assistants and Nurse Practitioners) who all work together to provide you with the care you need, when you need it. ? ?We recommend signing up for the patient portal called "MyChart".  Sign up information is provided on this After Visit Summary.  MyChart is used to connect with patients for Virtual Visits (Telemedicine).  Patients are able to view lab/test results, encounter notes, upcoming appointments, etc.  Non-urgent messages can be sent to your provider as well.   ?To learn more about what you can do with MyChart, go to [email or web address].   ? ?Your next appointment:   ?6 month(s) ? ?The format for your next appointment:   ?In Person ? ?Provider:   ?Janina Mayo, MD   ?  ? ?Signed, ?Janina Mayo,  MD  ?08/30/2021 6:08 PM    ?[place] ?

## 2021-08-30 NOTE — Progress Notes (Signed)
? ?Patient Care Team: ?Gwenyth Bender, MD as PCP - General (Internal Medicine) ?Maisie Fus, MD as PCP - Cardiology (Cardiology) ? ?DIAGNOSIS:  ?Encounter Diagnosis  ?Name Primary?  ? Iron deficiency anemia, unspecified iron deficiency anemia type   ? ? ?CHIEF COMPLIANT: Follow-up of IDA ? ?INTERVAL HISTORY: Joan [name] is 56 y.o. with above-mentioned history of IDA. She presents to the clinic today for follow-up and labs.  She reports no new problems or concerns.  Denies any fatigue or shortness of breath to exertion or lightheadedness or dizziness. ? ? ?ALLERGIES:  has No Known Allergies. ? ?MEDICATIONS:  ?Current Outpatient Medications  ?Medication Sig Dispense Refill  ? amLODipine (NORVASC) 10 MG tablet Take 10 mg by mouth daily.      ? aspirin 325 MG tablet Take 81 mg by mouth daily.    ? lisinopril (PRINIVIL,ZESTRIL) 2.5 MG tablet Take 2.5 mg by mouth daily.    ? pantoprazole (PROTONIX) 40 MG tablet Take 1 tablet (40 mg total) by mouth daily. 30 tablet 1  ? sertraline (ZOLOFT) 100 MG tablet Take 100 mg by mouth daily.    ? ?No current facility-administered medications for this visit.  ? ? ?PHYSICAL EXAMINATION: ?ECOG PERFORMANCE STATUS: 1 - Symptomatic but completely ambulatory ? ?Vitals:  ? 09/03/21 1313  ?BP: 134/76  ?Pulse: 77  ?Resp: 18  ?Temp: (!) 97.5 ?F (36.4 ?C)  ?SpO2: 98%  ? ?Filed Weights  ? 09/03/21 1313  ?Weight: 137 lb 4.8 oz (62.3 kg)  ? ? ? ?LABORATORY DATA:  ?I have reviewed the data as listed ?CMP Latest Ref Rng & Units 04/19/2021 04/17/2021 05/06/2015  ?Glucose 70 - 99 mg/dL 937(T) 024(O) 70  ?BUN 6 - 20 mg/dL 7 8 11   ?Creatinine 0.44 - 1.00 mg/dL ) 9.73(Z) 3.29(J  ?Sodium 135 - 145 mmol/L 135 136 137  ?Potassium 3.5 - 5.1 mmol/L 3.7 3.7 3.9  ?Chloride 98 - 111 mmol/L 107 103 102  ?CO2 22 - 32 mmol/L 22 22 25   ?Calcium 8.9 - 10.3 mg/dL 2.42) 9.0 8.9  ?Total Protein 6.0 - 8.3 g/dL - - -  ?Total Bilirubin 0.3 - 1.2 mg/dL - - -  ?Alkaline Phos 39 - 117 U/L - - -  ?AST 0 - 37 U/L - -  -  ?ALT 0 - 35 U/L - - -  ? ? ?Lab Results  ?Component Value Date  ? WBC 5.2 09/03/2021  ? HGB 14.7 09/03/2021  ? HCT 43.6 09/03/2021  ? MCV 91.8 09/03/2021  ? PLT 283 09/03/2021  ? NEUTROABS 2.6 09/03/2021  ? ? ?ASSESSMENT & PLAN:  ?Iron deficiency anemia ?IDA due to GI Bleed: EGD: unremarkable, Capsule endoscopy Neg,   ?Hospitalization: 04/17/21-04/19/21 ?Biopsies for H.Pylori: Neg, No celiac changes ?  ?IV iron: November 2022 ?Lab review: ?04/17/2021: Hemoglobin 5.7 (hospitalization)  ?06/05/2021: Hemoglobin 14.4 ?333 2023: Hemoglobin 14.7 ?  ?No further issues with iron deficiency anemia at this time. ?Return to clinic in 6 months with labs and if those labs are good then we can see her on an as-needed basis. ? ? ?No orders of the defined types were placed in this encounter. ? ?The patient has a good understanding of the overall plan. she agrees with it. she will call with any problems that may develop before the next visit here. ?Total time spent: 30 mins including face to face time and time spent for planning, charting and co-ordination of care ? ? 04/19/2021, MD ?09/03/21 ? ?I, Deritra  Trinda Pascal, is acting as a Neurosurgeon for Dr. Pamelia Hoit  ?  ?

## 2021-09-03 ENCOUNTER — Other Ambulatory Visit: Payer: Self-pay

## 2021-09-03 ENCOUNTER — Inpatient Hospital Stay: Payer: Self-pay | Attending: Hematology and Oncology

## 2021-09-03 ENCOUNTER — Inpatient Hospital Stay (HOSPITAL_BASED_OUTPATIENT_CLINIC_OR_DEPARTMENT_OTHER): Payer: No Typology Code available for payment source | Admitting: Hematology and Oncology

## 2021-09-03 DIAGNOSIS — D509 Iron deficiency anemia, unspecified: Secondary | ICD-10-CM

## 2021-09-03 DIAGNOSIS — D500 Iron deficiency anemia secondary to blood loss (chronic): Secondary | ICD-10-CM | POA: Insufficient documentation

## 2021-09-03 LAB — CBC WITH DIFFERENTIAL (CANCER CENTER ONLY)
Abs Immature Granulocytes: 0.02 10*3/uL (ref 0.00–0.07)
Basophils Absolute: 0 10*3/uL (ref 0.0–0.1)
Basophils Relative: 1 %
Eosinophils Absolute: 0.2 10*3/uL (ref 0.0–0.5)
Eosinophils Relative: 4 %
HCT: 43.6 % (ref 36.0–46.0)
Hemoglobin: 14.7 g/dL (ref 12.0–15.0)
Immature Granulocytes: 0 %
Lymphocytes Relative: 34 %
Lymphs Abs: 1.7 10*3/uL (ref 0.7–4.0)
MCH: 30.9 pg (ref 26.0–34.0)
MCHC: 33.7 g/dL (ref 30.0–36.0)
MCV: 91.8 fL (ref 80.0–100.0)
Monocytes Absolute: 0.5 10*3/uL (ref 0.1–1.0)
Monocytes Relative: 10 %
Neutro Abs: 2.6 10*3/uL (ref 1.7–7.7)
Neutrophils Relative %: 51 %
Platelet Count: 283 10*3/uL (ref 150–400)
RBC: 4.75 MIL/uL (ref 3.87–5.11)
RDW: 13.7 % (ref 11.5–15.5)
WBC Count: 5.2 10*3/uL (ref 4.0–10.5)
nRBC: 0 % (ref 0.0–0.2)

## 2021-09-03 LAB — IRON AND IRON BINDING CAPACITY (CC-WL,HP ONLY)
Iron: 85 ug/dL (ref 28–170)
Saturation Ratios: 17 % (ref 10.4–31.8)
TIBC: 498 ug/dL — ABNORMAL HIGH (ref 250–450)
UIBC: 413 ug/dL (ref 148–442)

## 2021-09-03 LAB — FERRITIN: Ferritin: 37 ng/mL (ref 11–307)

## 2021-09-03 NOTE — Assessment & Plan Note (Signed)
IDA due to GI Bleed: EGD: unremarkable, Capsule endoscopy Neg, ? ?Hospitalization: 04/17/21-04/19/21 ?Biopsies for H.Pylori: Neg, No celiac changes ?? ?IV iron: November 2022 ?Lab review: ?04/17/2021: Hemoglobin 5.7 (hospitalization)  ?06/05/2021: Hemoglobin 14.4 ?? ?? ?Return to clinic in 3 months for labs and follow-up ?

## 2021-09-11 ENCOUNTER — Encounter (HOSPITAL_COMMUNITY): Payer: Self-pay

## 2021-09-11 ENCOUNTER — Encounter (HOSPITAL_COMMUNITY): Payer: Self-pay | Admitting: Internal Medicine

## 2021-09-11 ENCOUNTER — Ambulatory Visit (HOSPITAL_COMMUNITY): Payer: No Typology Code available for payment source | Attending: Cardiology

## 2021-09-11 NOTE — Progress Notes (Signed)
Verified appointment "no show" status with Joan [name] at 11:38.  ?

## 2022-01-24 IMAGING — DX DG CHEST 2V
2 series · 2 of 2 positions shown · non-contrast
Comparison: Chest x-ray 05/06/2015

CLINICAL DATA: Shortness of breath

EXAM:
CHEST - 2 VIEW

[w chest pa]
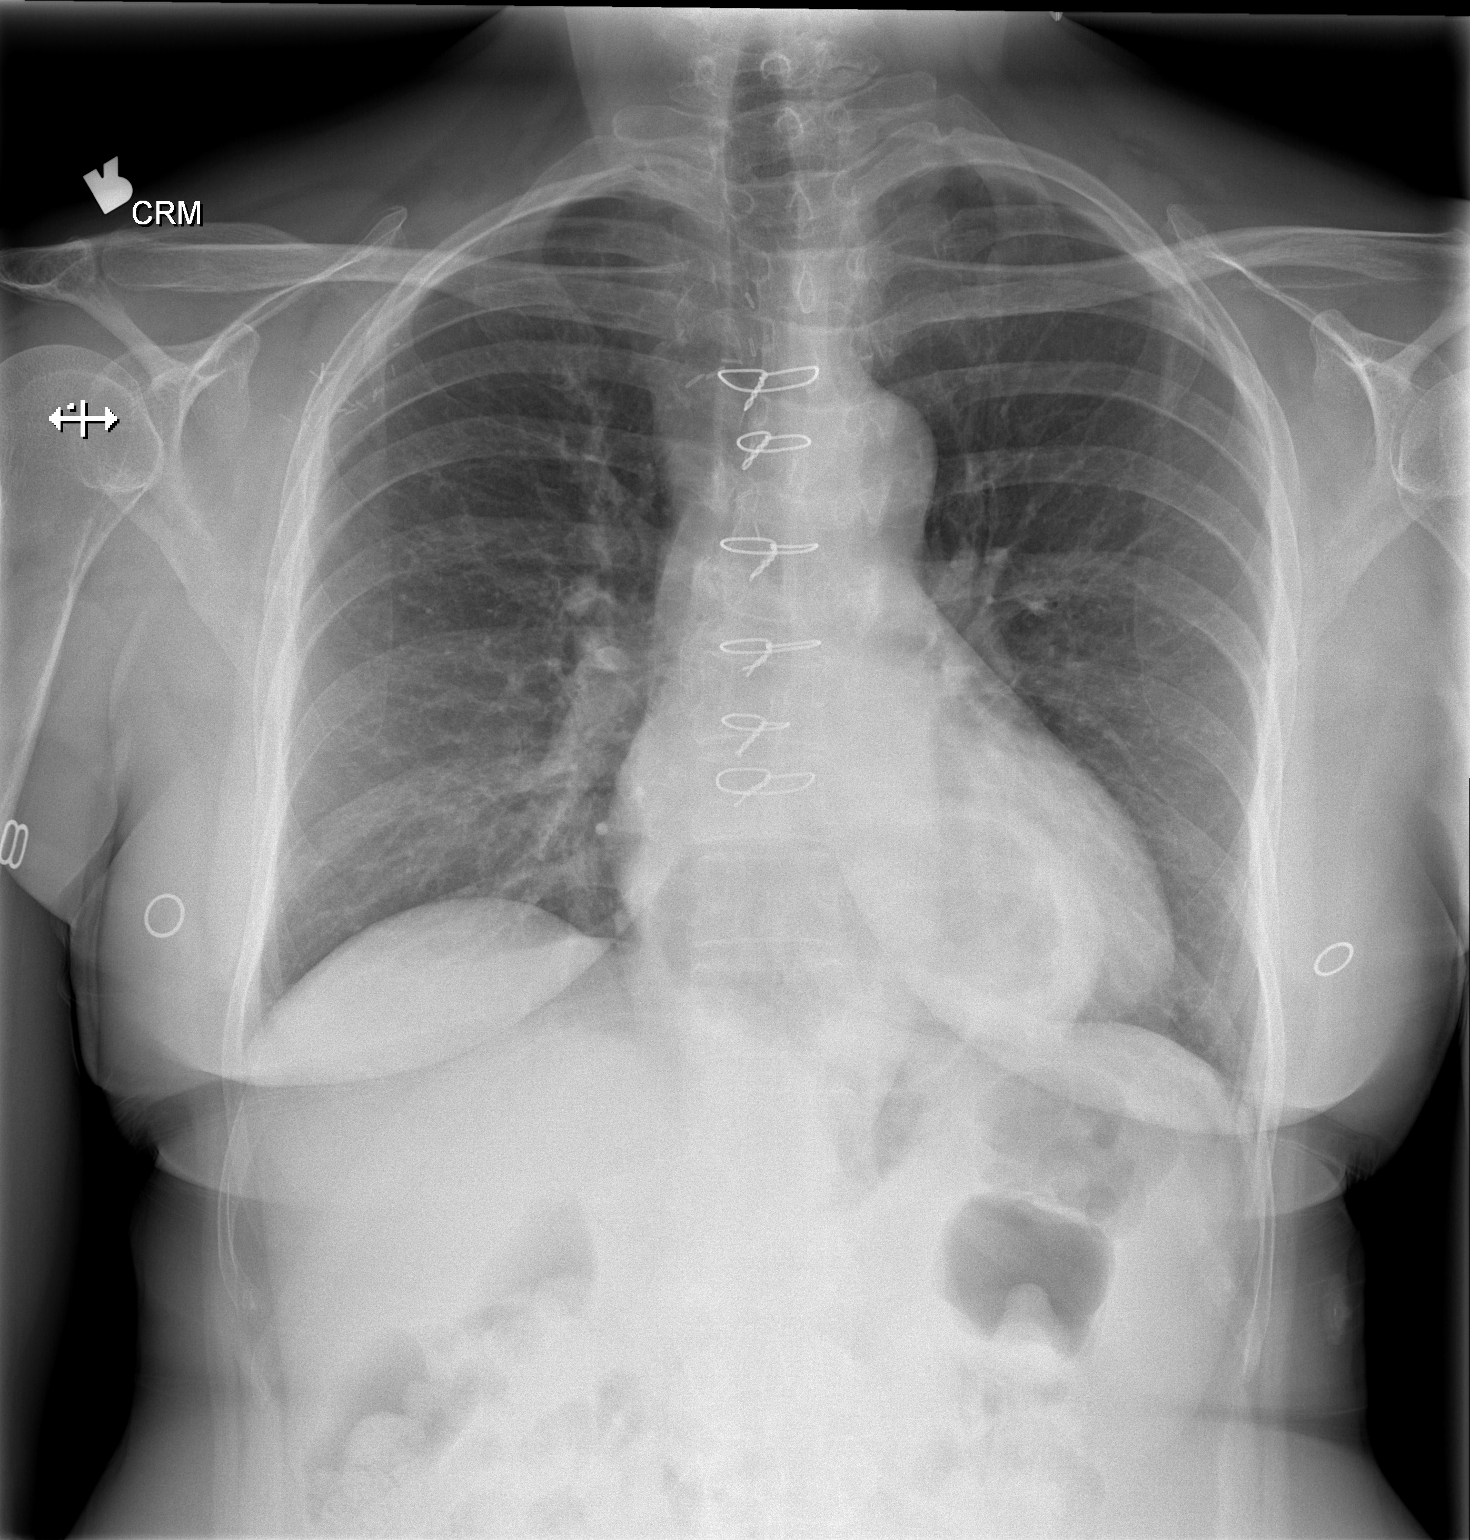

[w chest lat]
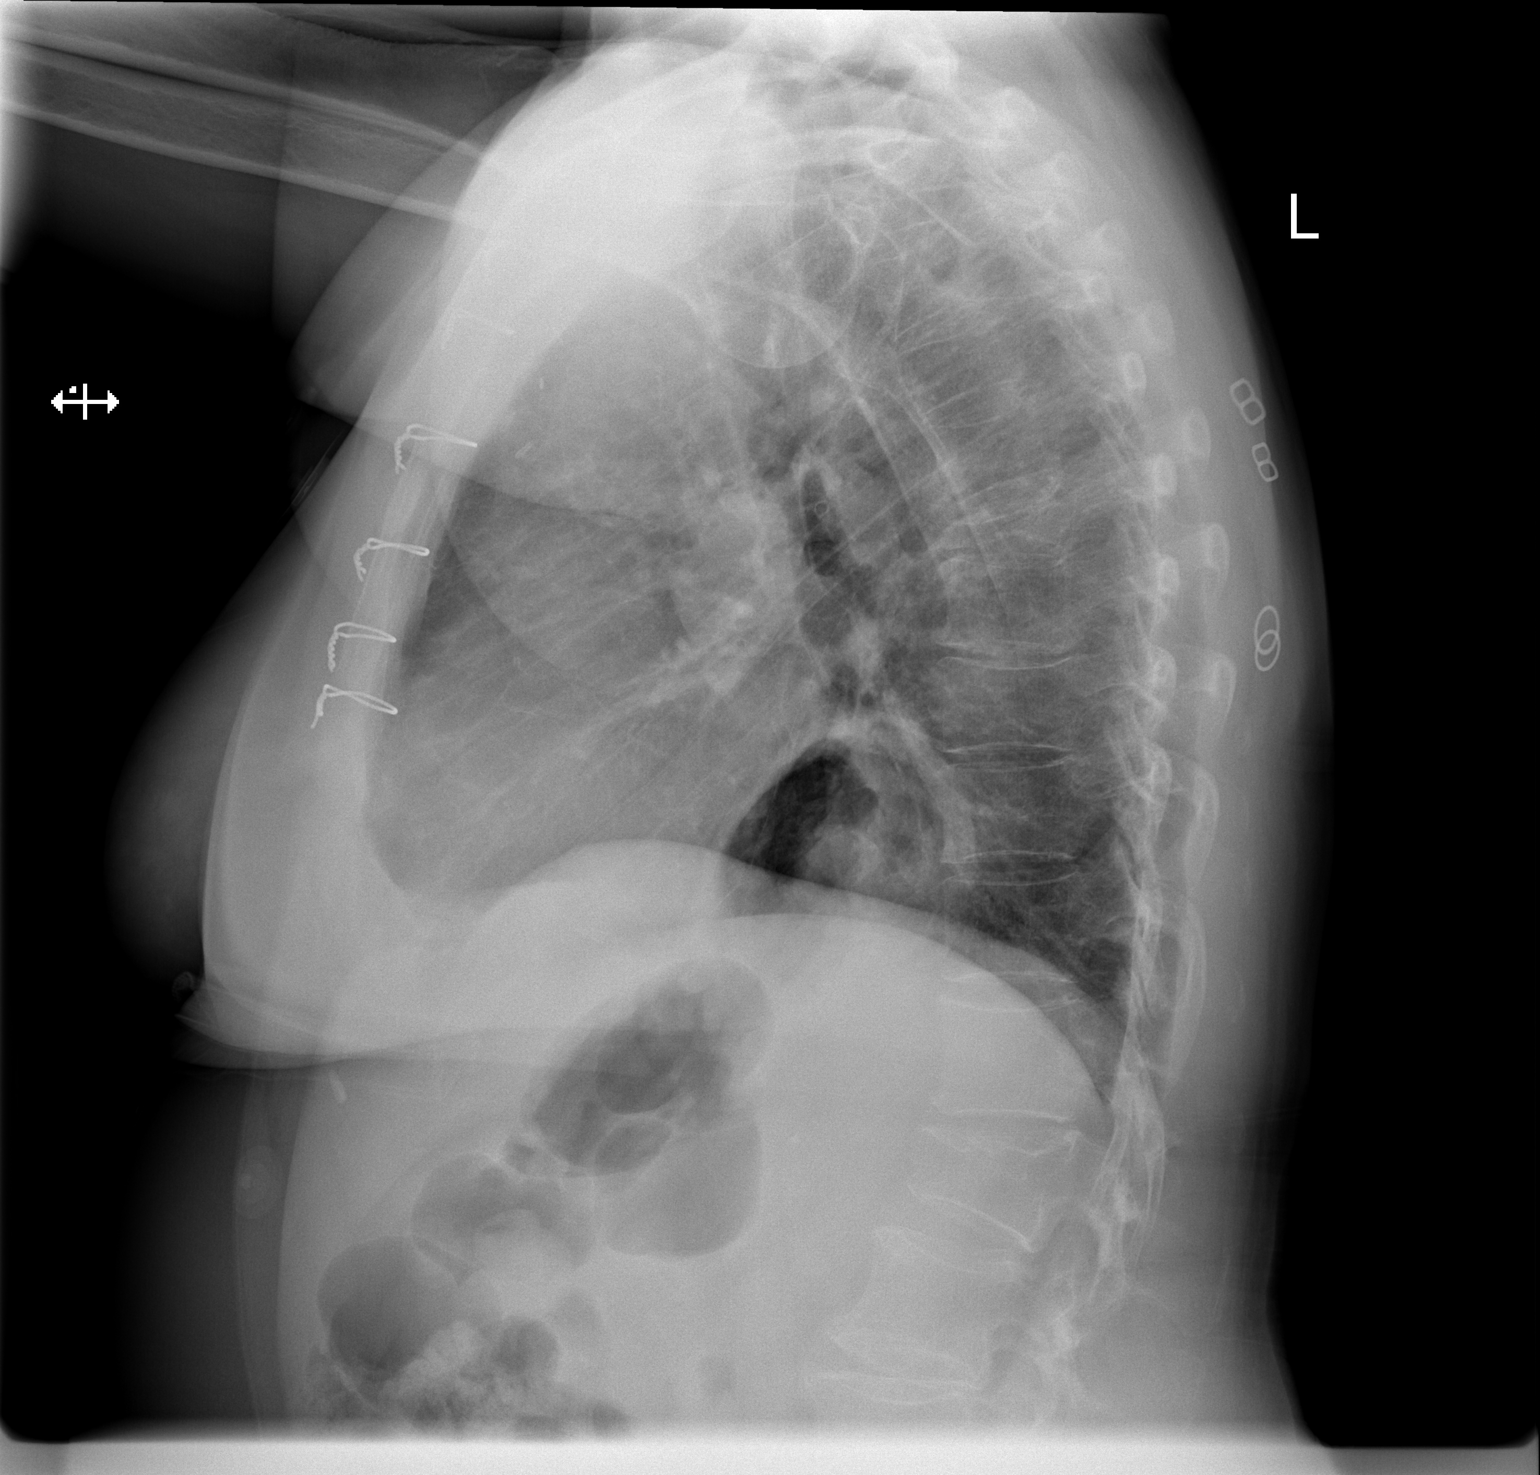

[2 of 2 positions shown; findings below may reference images not displayed]

FINDINGS: Heart is mildly enlarged. Mediastinum within normal limits. Median
sternotomy wires. Pulmonary vasculature appears normal. No focal
consolidation identified. No pleural effusion or pneumothorax.
Moderate to large hiatal hernia.
IMPRESSION: 1. No acute intrathoracic process identified.
2. Mild cardiomegaly.
3. Hiatal hernia.

## 2022-02-18 ENCOUNTER — Encounter: Payer: Self-pay | Admitting: Internal Medicine

## 2022-02-18 ENCOUNTER — Ambulatory Visit: Payer: Self-pay | Admitting: Internal Medicine

## 2022-02-18 VITALS — BP 170/108 | HR 84 | Resp 16 | Ht 61.5 in | Wt 130.0 lb

## 2022-02-18 DIAGNOSIS — K219 Gastro-esophageal reflux disease without esophagitis: Secondary | ICD-10-CM

## 2022-02-18 DIAGNOSIS — Z23 Encounter for immunization: Secondary | ICD-10-CM

## 2022-02-18 DIAGNOSIS — I10 Essential (primary) hypertension: Secondary | ICD-10-CM

## 2022-02-18 DIAGNOSIS — D509 Iron deficiency anemia, unspecified: Secondary | ICD-10-CM

## 2022-02-18 DIAGNOSIS — F419 Anxiety disorder, unspecified: Secondary | ICD-10-CM

## 2022-02-18 MED ORDER — AMLODIPINE BESYLATE 10 MG PO TABS: 10.0000 mg | ORAL_TABLET | Freq: Every day | ORAL | 1 refills | Status: DC

## 2022-02-18 MED ORDER — AMLODIPINE BESYLATE 10 MG PO TABS: 10.0000 mg | ORAL_TABLET | Freq: Every day | ORAL | 3 refills | Status: DC

## 2022-02-18 MED ORDER — LISINOPRIL 5 MG PO TABS: ORAL_TABLET | ORAL | 3 refills | Status: DC

## 2022-02-18 MED ORDER — SERTRALINE HCL 100 MG PO TABS: 100.0000 mg | ORAL_TABLET | Freq: Every day | ORAL | 3 refills | Status: DC

## 2022-02-18 MED ORDER — SERTRALINE HCL 100 MG PO TABS: ORAL_TABLET | ORAL | 1 refills | Status: DC
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
## 2022-02-19 ENCOUNTER — Telehealth: Payer: Self-pay | Admitting: Hematology and Oncology

## 2022-02-19 NOTE — Telephone Encounter (Signed)
Rescheduled appointment per provider [facility]. Patient is aware of the changes made to the patiens upcoming appointment.

## 2022-03-04 ENCOUNTER — Other Ambulatory Visit: Payer: Self-pay

## 2022-03-04 VITALS — BP 126/80 | HR 76

## 2022-03-04 DIAGNOSIS — Z0130 Encounter for examination of blood pressure without abnormal findings: Secondary | ICD-10-CM

## 2022-03-04 DIAGNOSIS — Z13220 Encounter for screening for lipoid disorders: Secondary | ICD-10-CM

## 2022-03-04 DIAGNOSIS — D509 Iron deficiency anemia, unspecified: Secondary | ICD-10-CM

## 2022-03-04 NOTE — Progress Notes (Signed)
Patient has been taking bp medication consistently  After reporting bp to Dr Mulberry, no changes to medication will be made  

## 2022-03-05 ENCOUNTER — Ambulatory Visit: Payer: Self-pay | Attending: Internal Medicine | Admitting: Internal Medicine

## 2022-03-06 ENCOUNTER — Inpatient Hospital Stay: Payer: Self-pay | Admitting: Hematology and Oncology

## 2022-03-06 ENCOUNTER — Inpatient Hospital Stay: Payer: Self-pay

## 2022-03-06 LAB — CBC WITH DIFFERENTIAL/PLATELET
Basophils Absolute: 0 10*3/uL (ref 0.0–0.2)
Basos: 1 %
EOS (ABSOLUTE): 0.2 10*3/uL (ref 0.0–0.4)
Eos: 3 %
Hematocrit: 33.4 % — ABNORMAL LOW (ref 34.0–46.6)
Hemoglobin: 8.9 g/dL — ABNORMAL LOW (ref 11.1–15.9)
Immature Grans (Abs): 0 10*3/uL (ref 0.0–0.1)
Immature Granulocytes: 0 %
Lymphocytes Absolute: 1.3 10*3/uL (ref 0.7–3.1)
Lymphs: 26 %
MCH: 22 pg — ABNORMAL LOW (ref 26.6–33.0)
MCHC: 26.6 g/dL — ABNORMAL LOW (ref 31.5–35.7)
MCV: 83 fL (ref 79–97)
Monocytes Absolute: 0.4 10*3/uL (ref 0.1–0.9)
Monocytes: 8 %
Neutrophils Absolute: 3 10*3/uL (ref 1.4–7.0)
Neutrophils: 62 %
Platelets: 383 10*3/uL (ref 150–450)
RBC: 4.05 x10E6/uL (ref 3.77–5.28)
RDW: 15.7 % — ABNORMAL HIGH (ref 11.7–15.4)
WBC: 4.8 10*3/uL (ref 3.4–10.8)

## 2022-03-06 LAB — COMPREHENSIVE METABOLIC PANEL
ALT: 15 IU/L (ref 0–32)
AST: 20 IU/L (ref 0–40)
Albumin/Globulin Ratio: 1.7 (ref 1.2–2.2)
Albumin: 4.1 g/dL (ref 3.8–4.9)
Alkaline Phosphatase: 104 IU/L (ref 44–121)
BUN/Creatinine Ratio: 14 (ref 9–23)
BUN: 12 mg/dL (ref 6–24)
Bilirubin Total: 0.4 mg/dL (ref 0.0–1.2)
CO2: 22 mmol/L (ref 20–29)
Calcium: 8.8 mg/dL (ref 8.7–10.2)
Chloride: 102 mmol/L (ref 96–106)
Creatinine, Ser: 0.87 mg/dL (ref 0.57–1.00)
Globulin, Total: 2.4 g/dL (ref 1.5–4.5)
Glucose: 89 mg/dL (ref 70–99)
Potassium: 4.1 mmol/L (ref 3.5–5.2)
Sodium: 138 mmol/L (ref 134–144)
Total Protein: 6.5 g/dL (ref 6.0–8.5)
eGFR: 78 mL/min/{1.73_m2} (ref >59)

## 2022-03-06 LAB — LIPID PANEL W/O CHOL/HDL RATIO
Cholesterol, Total: 156 mg/dL (ref 100–199)
HDL: 52 mg/dL (ref >39)
LDL Chol Calc (NIH): 81 mg/dL (ref 0–99)
Triglycerides: 132 mg/dL (ref 0–149)
VLDL Cholesterol Cal: 23 mg/dL (ref 5–40)

## 2022-03-14 ENCOUNTER — Inpatient Hospital Stay: Payer: Self-pay | Admitting: Hematology and Oncology

## 2022-03-14 ENCOUNTER — Inpatient Hospital Stay: Payer: Self-pay

## 2022-03-14 NOTE — Assessment & Plan Note (Deleted)
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 Telephone visit to review the results of the blood work

## 2022-04-03 ENCOUNTER — Inpatient Hospital Stay: Payer: Self-pay

## 2022-04-03 ENCOUNTER — Inpatient Hospital Stay: Payer: Self-pay | Admitting: Hematology and Oncology

## 2022-04-05 NOTE — Progress Notes (Signed)
Patient Care Team: Julieanne Manson, MD as PCP - General (Internal Medicine) Maisie Fus, MD as PCP - Cardiology (Cardiology)  DIAGNOSIS: No diagnosis found.  SUMMARY OF ONCOLOGIC HISTORY: Oncology History   No history exists.    CHIEF COMPLIANT: Follow-up of IDA  INTERVAL HISTORY: Joan [name] is a 56 y.o. with above-mentioned history of IDA. She presents to the clinic today for follow-up    ALLERGIES:  has No Known Allergies.  MEDICATIONS:  Current Outpatient Medications  Medication Sig Dispense Refill   amLODipine (NORVASC) 10 MG tablet Take 1 tablet (10 mg total) by mouth daily. 30 tablet 1   amLODipine (NORVASC) 10 MG tablet Take 1 tablet (10 mg total) by mouth daily. 90 tablet 3   aspirin 325 MG tablet Take 81 mg by mouth daily. (Patient not taking: Reported on 02/18/2022)     lisinopril (ZESTRIL) 5 MG tablet 1/2 tab by mouth daily 45 tablet 3   pantoprazole (PROTONIX) 40 MG tablet Take 1 tablet (40 mg total) by mouth daily. 30 tablet 1   sertraline (ZOLOFT) 100 MG tablet 1/2 tab by mouth daily for 7 days then 1 tab by mouth daily 30 tablet 1   sertraline (ZOLOFT) 100 MG tablet Take 1 tablet (100 mg total) by mouth daily. 90 tablet 3   No current facility-administered medications for this visit.    PHYSICAL EXAMINATION: ECOG PERFORMANCE STATUS: {CHL ONC ECOG [phone]}  There were no vitals filed for this visit. There were no vitals filed for this visit.  BREAST:*** No palpable masses or nodules in either right or left breasts. No palpable axillary supraclavicular or infraclavicular adenopathy no breast tenderness or nipple discharge. (exam performed in the presence of a chaperone)  LABORATORY DATA:  I have reviewed the data as listed    Latest Ref Rng & Units 03/04/2022    9:23 AM 04/19/2021    1:57 AM 04/17/2021   12:55 PM  CMP  Glucose 70 - 99 mg/dL 89  161  096   BUN 6 - 24 mg/dL 12  7  8    Creatinine 0.57 - 1.00 mg/dL  0.45  4.09    Sodium 134 - 144 mmol/L 138  135  136   Potassium 3.5 - 5.2 mmol/L 4.1  3.7  3.7   Chloride 96 - 106 mmol/L 102  107  103   CO2 20 - 29 mmol/L 22  22  22    Calcium 8.7 - 10.2 mg/dL 8.8  8.4  9.0   Total Protein 6.0 - 8.5 g/dL 6.5     Total Bilirubin 0.0 - 1.2 mg/dL 0.4     Alkaline Phos 44 - 121 IU/L 104     AST 0 - 40 IU/L 20     ALT 0 - 32 IU/L 15       Lab Results  Component Value Date   WBC 4.8 03/04/2022   HGB 8.9 (L) 03/04/2022   HCT 33.4 (L) 03/04/2022   MCV 83 03/04/2022   PLT 383 03/04/2022   NEUTROABS 3.0 03/04/2022    ASSESSMENT & PLAN:  No problem-specific Assessment & Plan notes found for this encounter.    No orders of the defined types were placed in this encounter.  The patient has a good understanding of the overall plan. she agrees with it. she will call with any problems that may develop before the next visit here. Total time spent: 30 mins including face to face time and time spent for  planning, charting and co-ordination of care   Suzzette Righter, [place] 04/05/22    I Gardiner Coins am scribing for Dr. Lindi Adie  ***

## 2022-04-08 ENCOUNTER — Inpatient Hospital Stay (HOSPITAL_BASED_OUTPATIENT_CLINIC_OR_DEPARTMENT_OTHER): Payer: No Typology Code available for payment source | Admitting: Hematology and Oncology

## 2022-04-08 ENCOUNTER — Other Ambulatory Visit: Payer: Self-pay

## 2022-04-08 ENCOUNTER — Inpatient Hospital Stay: Payer: No Typology Code available for payment source | Attending: Hematology and Oncology

## 2022-04-08 DIAGNOSIS — D509 Iron deficiency anemia, unspecified: Secondary | ICD-10-CM

## 2022-04-08 DIAGNOSIS — D500 Iron deficiency anemia secondary to blood loss (chronic): Secondary | ICD-10-CM | POA: Insufficient documentation

## 2022-04-08 LAB — CBC WITH DIFFERENTIAL (CANCER CENTER ONLY)
Abs Immature Granulocytes: 0.01 10*3/uL (ref 0.00–0.07)
Basophils Absolute: 0.1 10*3/uL (ref 0.0–0.1)
Basophils Relative: 1 %
Eosinophils Absolute: 0.2 10*3/uL (ref 0.0–0.5)
Eosinophils Relative: 4 %
HCT: 30.2 % — ABNORMAL LOW (ref 36.0–46.0)
Hemoglobin: 8.8 g/dL — ABNORMAL LOW (ref 12.0–15.0)
Immature Granulocytes: 0 %
Lymphocytes Relative: 25 %
Lymphs Abs: 1.4 10*3/uL (ref 0.7–4.0)
MCH: 20.6 pg — ABNORMAL LOW (ref 26.0–34.0)
MCHC: 29.1 g/dL — ABNORMAL LOW (ref 30.0–36.0)
MCV: 70.7 fL — ABNORMAL LOW (ref 80.0–100.0)
Monocytes Absolute: 0.7 10*3/uL (ref 0.1–1.0)
Monocytes Relative: 12 %
Neutro Abs: 3.3 10*3/uL (ref 1.7–7.7)
Neutrophils Relative %: 58 %
Platelet Count: 395 10*3/uL (ref 150–400)
RBC: 4.27 MIL/uL (ref 3.87–5.11)
RDW: 17.4 % — ABNORMAL HIGH (ref 11.5–15.5)
WBC Count: 5.7 10*3/uL (ref 4.0–10.5)
nRBC: 0 % (ref 0.0–0.2)

## 2022-04-08 LAB — IRON AND IRON BINDING CAPACITY (CC-WL,HP ONLY)
Iron: 26 ug/dL — ABNORMAL LOW (ref 28–170)
Saturation Ratios: 5 % — ABNORMAL LOW (ref 10.4–31.8)
TIBC: 539 ug/dL — ABNORMAL HIGH (ref 250–450)
UIBC: 513 ug/dL — ABNORMAL HIGH (ref 148–442)

## 2022-04-08 LAB — FERRITIN: Ferritin: 3 ng/mL — ABNORMAL LOW (ref 11–307)

## 2022-04-08 NOTE — Assessment & Plan Note (Addendum)
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 Because she has black stools I sent a message to Dr. Candis Schatz with gastroenterology to evaluate her for GI bleeding.  Her sister accompanied her also appears to be iron deficient because she is also craving ice chips.  Return to clinic in 4 months with labs and follow-up.

## 2022-04-17 ENCOUNTER — Inpatient Hospital Stay: Payer: No Typology Code available for payment source

## 2022-04-17 VITALS — BP 133/72 | HR 75 | Temp 98.1°F | Resp 17

## 2022-04-17 DIAGNOSIS — D509 Iron deficiency anemia, unspecified: Secondary | ICD-10-CM

## 2022-04-17 MED ORDER — SODIUM CHLORIDE 0.9 % IV SOLN: Freq: Once | INTRAVENOUS | Status: AC

## 2022-04-17 MED ORDER — SODIUM CHLORIDE 0.9 % IV SOLN
300.0000 mg | Freq: Once | INTRAVENOUS | Status: AC
  Administered 2022-04-17: 300 mg via INTRAVENOUS
  Filled 2022-04-17: qty 300

## 2022-04-17 NOTE — Patient Instructions (Signed)

## 2022-04-25 ENCOUNTER — Other Ambulatory Visit: Payer: Self-pay

## 2022-04-25 ENCOUNTER — Inpatient Hospital Stay: Payer: No Typology Code available for payment source | Attending: Hematology and Oncology

## 2022-04-25 VITALS — BP 160/77 | HR 76 | Temp 98.1°F | Resp 18 | Wt 131.2 lb

## 2022-04-25 DIAGNOSIS — D500 Iron deficiency anemia secondary to blood loss (chronic): Secondary | ICD-10-CM | POA: Insufficient documentation

## 2022-04-25 DIAGNOSIS — D509 Iron deficiency anemia, unspecified: Secondary | ICD-10-CM

## 2022-04-25 MED ORDER — SODIUM CHLORIDE 0.9 % IV SOLN: Freq: Once | INTRAVENOUS | Status: AC

## 2022-04-25 MED ORDER — SODIUM CHLORIDE 0.9 % IV SOLN
300.0000 mg | Freq: Once | INTRAVENOUS | Status: AC
  Administered 2022-04-25: 300 mg via INTRAVENOUS
  Filled 2022-04-25: qty 300

## 2022-04-25 NOTE — Patient Instructions (Signed)

## 2022-04-30 ENCOUNTER — Encounter: Payer: Self-pay | Admitting: Hematology and Oncology

## 2022-05-02 ENCOUNTER — Inpatient Hospital Stay: Payer: No Typology Code available for payment source

## 2022-05-27 ENCOUNTER — Inpatient Hospital Stay: Payer: No Typology Code available for payment source | Attending: Hematology and Oncology

## 2022-05-27 ENCOUNTER — Other Ambulatory Visit: Payer: Self-pay

## 2022-05-27 VITALS — BP 126/75 | HR 81 | Temp 98.2°F | Resp 16

## 2022-05-27 DIAGNOSIS — D500 Iron deficiency anemia secondary to blood loss (chronic): Secondary | ICD-10-CM | POA: Insufficient documentation

## 2022-05-27 DIAGNOSIS — Z79899 Other long term (current) drug therapy: Secondary | ICD-10-CM | POA: Insufficient documentation

## 2022-05-27 DIAGNOSIS — D509 Iron deficiency anemia, unspecified: Secondary | ICD-10-CM

## 2022-05-27 MED ORDER — SODIUM CHLORIDE 0.9 % IV SOLN
300.0000 mg | Freq: Once | INTRAVENOUS | Status: AC
  Administered 2022-05-27: 300 mg via INTRAVENOUS
  Filled 2022-05-27: qty 300

## 2022-05-27 MED ORDER — SODIUM CHLORIDE 0.9 % IV SOLN: Freq: Once | INTRAVENOUS | Status: AC

## 2022-05-27 NOTE — Progress Notes (Signed)
Pt declined to be observed for 30 minutes post Venofer infusion. Pt tolerated trtmt well w/out incident. VSS at discharge.  Ambulatory to lobby.   

## 2022-05-27 NOTE — Patient Instructions (Signed)

## 2022-06-27 ENCOUNTER — Other Ambulatory Visit: Payer: Self-pay | Admitting: Internal Medicine

## 2022-06-27 ENCOUNTER — Encounter: Payer: Self-pay | Admitting: Internal Medicine

## 2022-06-27 ENCOUNTER — Ambulatory Visit: Payer: Self-pay | Admitting: Internal Medicine

## 2022-06-27 VITALS — BP 114/80 | HR 80 | Resp 20 | Ht 61.25 in | Wt 131.0 lb

## 2022-06-27 DIAGNOSIS — F419 Anxiety disorder, unspecified: Secondary | ICD-10-CM

## 2022-06-27 DIAGNOSIS — I10 Essential (primary) hypertension: Secondary | ICD-10-CM

## 2022-06-27 DIAGNOSIS — Z1231 Encounter for screening mammogram for malignant neoplasm of breast: Secondary | ICD-10-CM

## 2022-06-27 DIAGNOSIS — R87610 Atypical squamous cells of undetermined significance on cytologic smear of cervix (ASC-US): Secondary | ICD-10-CM

## 2022-06-27 DIAGNOSIS — F32A Depression, unspecified: Secondary | ICD-10-CM

## 2022-06-27 DIAGNOSIS — Z23 Encounter for immunization: Secondary | ICD-10-CM

## 2022-06-27 DIAGNOSIS — B351 Tinea unguium: Secondary | ICD-10-CM

## 2022-06-27 DIAGNOSIS — H547 Unspecified visual loss: Secondary | ICD-10-CM

## 2022-06-27 DIAGNOSIS — B353 Tinea pedis: Secondary | ICD-10-CM

## 2022-06-27 DIAGNOSIS — D509 Iron deficiency anemia, unspecified: Secondary | ICD-10-CM

## 2022-06-27 DIAGNOSIS — Z124 Encounter for screening for malignant neoplasm of cervix: Secondary | ICD-10-CM

## 2022-06-27 DIAGNOSIS — Z0000 Encounter for general adult medical examination without abnormal findings: Secondary | ICD-10-CM

## 2022-06-27 MED ORDER — TERBINAFINE HCL 250 MG PO TABS: 250.0000 mg | ORAL_TABLET | Freq: Every day | ORAL | 0 refills | Status: DC

## 2022-06-27 NOTE — Progress Notes (Signed)
Subjective:    Patient ID: Joan [name], female   DOB: 1965-09-03, 57 y.o.   MRN: [id number]   HPI  CPE with pap  1.  Pap:  NILM with + HPV apparently in 03/2021.  Unable to open up report from that pap.  Recommended cotesting in one year.  2.  Mammogram:  Never.  Paternal grandmother with breast cancer in her 44s.  Death occurred a distance following surgery, not clear if cause of death.    3.  Osteoprevention:  Drinks 2-4 cups of 1% milk daily.  Sedentary life.  She could walk near their home, just does not do it.  Does not know neighbors well, though has lived in current home since 89s.    4.  Guaiac Cards/FIT:  Negative 03/2021.    5.  Colonoscopy:  Only one was 04/18/21 for iron deficiency anemia with Dr. Candis Schatz.  EGD and upper GI capsule endoscopy without abnormalities.  Path from biopsies without abnormality as well, including H. Pylori.  Follows with Dr. Lindi Adie, Heme/Onc for iron infusions.    Her hemoglobin dropped again in Sept/Oct.  Appears Dr. Lindi Adie sending back to Dr. Candis Schatz as she was having black stools as well.    6.  Immunizations:   Has not had a new COVID booster, influenza or second Shignrix.  Reportedly has had 2 COVID vaccines in past.  Only able to find one from 10/27/19 in [facility] History  Administered Date(s) Administered   Influenza Split 04/28/2019   Influenza, Seasonal, Injecte, Preservative Fre 04/14/2013, 04/06/2014, 08/03/2018, 04/17/2021   Influenza-Unspecified 04/18/2021   Tdap 02/28/2012, 05/06/2015   Zoster Recombinat (Shingrix) 02/18/2022     7.  Glucose/Cholesterol:  Glucose 89 and cholesterol panel fine in September. Lipid Panel     Component Value Date/Time   CHOL 156 03/04/2022 0923   TRIG 132 03/04/2022 0923   HDL 52 03/04/2022 0923   LDLCALC 81 03/04/2022 0923   LABVLDL 23 03/04/2022 0923     Current Meds  Medication Sig   amLODipine (NORVASC) 10 MG tablet Take 1 tablet (10 mg total) by mouth daily.    aspirin 325 MG tablet Take 81 mg by mouth daily.   lisinopril (ZESTRIL) 5 MG tablet 1/2 tab by mouth daily   sertraline (ZOLOFT) 100 MG tablet Take 1 tablet (100 mg total) by mouth daily.   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;  Service: Gastroenterology;  Laterality: N/A;  GIVENS CAPSULE STUDY N/A 04/18/2021   Procedure: GIVENS CAPSULE STUDY;  Surgeon: Daryel November, MD;  Location: [place];  Service: Gastroenterology;  Laterality: N/A;   Family History  Problem Relation Age of Onset   Heart disease Mother        MI, first MI suffered at age 67   COPD Mother        heavy smoker   Hyperlipidemia Father    Hypertension Father    Cancer Father 35       pancreatic   Atrial fibrillation Father        resolved now 2023   Anxiety disorder Sister        Panic disorder   Hyperlipidemia Sister    Heart disease Sister 5       2v CABG   Hypertension Sister    Fibrocystic breast disease Sister    Anxiety disorder Sister    COPD Sister         smoker   Anxiety disorder Brother    ADD / ADHD Brother    Anxiety disorder Daughter        and depression   Social History   Socioeconomic History   Marital status: Divorced    Spouse name: Not on file   Number of children: 1   Years of education: 9   Highest education level: High school graduate  Occupational History   Occupation: unemployed    Comment: 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  Tobacco Use   Smoking status: Never    Passive exposure: Current   Smokeless tobacco: Never  Vaping Use   Vaping Use: Never used  Substance and Sexual Activity   Alcohol use: Yes    Comment: 2 cans of beer every other day--uses to calm anxiety   Drug use: Not Currently    Types: Cocaine   Sexual activity: Not Currently    Birth control/protection: Post-menopausal    Comment: Years not active.  Other Topics Concern   Not on file  Social History Narrative   Lives with her father currently, who is also a stressor for her.   Older sister, Lattie Haw, also involved with his care.   Has a good relationship with her daughter.   Does not drive due to a DWI  in 2017.     Does not have the money to get a breathalizer in car and take care of a car.   Social Determinants of Health   Financial Resource Strain: Medium Risk (02/18/2022)   Overall Financial Resource Strain (CARDIA)    Difficulty of Paying Living Expenses: Somewhat hard  Food Insecurity: Food Insecurity Present (02/18/2022)   Hunger Vital Sign    Worried About Running Out of Food in the Last Year: Sometimes true    Ran Out of Food in the Last Year: Sometimes true  Transportation Needs: Unmet Transportation Needs (02/18/2022)   PRAPARE - Hydrologist (Medical): Yes    Lack of Transportation (Non-Medical): Yes  Physical Activity: Not on file  Stress: Not on file  Social Connections: Not on file  Intimate Partner Violence: Not At Risk (02/18/2022)   Humiliation, Afraid,  Rape, and Kick questionnaire    Fear of Current or Ex-Partner: No    Emotionally Abused: No    Physically Abused: No    Sexually Abused: No      Review of Systems  Eyes:  Positive for visual disturbance (She is cleared finally to renew  driver's license,but failed eye check and needs glasses.).  Respiratory:  Positive for cough (sometimes when anemic).   Cardiovascular:  Positive for palpitations (occasionally skips a beat.). Negative for chest pain and leg swelling.  Gastrointestinal:  Negative for abdominal pain and blood in stool (Melena maybe once weekly.  Does use pepto bismal intermittently as well, but has not paid attention to whether this is when she has black stools.).  Musculoskeletal:        Unable to completely straighten left elbow since fell and multiple areas of left elbow fractured in 06/2019.  Also describes chronic numbness and tingling of ulnar fingers of left hand.   Difficulty using hand with numbness and weakness.  Skin:        Needs help with cutting toenails and dry flaking skin of feet.  Does not have strength in hands to clip.  Psychiatric/Behavioral:         Panic attacks every other day since back on Sertraline.  Better than before.  Has not called to get set up with Gala Romney, LCSW-A. Thinking of applying for disability from both mental and physical health standpoint. Has not applied for Medicaid.      Objective:   BP 114/80 (BP Location: Right Arm, Patient Position: Sitting, Cuff Size: Normal)   Pulse 80   Resp 20   Ht 5' 1.25" (1.556 m)   Wt 131 lb (59.4 kg)   BMI 24.55 kg/m   Physical Exam HENT:     Head: Normocephalic and atraumatic.     Right Ear: Tympanic membrane, ear canal and external ear normal.     Left Ear: Tympanic membrane, ear canal and external ear normal.     Nose: Nose normal.     Mouth/Throat:     Mouth: Mucous membranes are moist.     Pharynx: Oropharynx is clear.  Eyes:     Extraocular Movements: Extraocular  movements intact.     Conjunctiva/sclera: Conjunctivae normal.     Pupils: Pupils are equal, round, and reactive to light.     Comments: Discs sharp  Neck:     Thyroid: No thyroid mass or thyromegaly.  Cardiovascular:     Rate and Rhythm: Normal rate and regular rhythm.     Heart sounds: S1 normal and S2 normal. No murmur heard.    No friction rub. No S3 or S4 sounds.     Comments: No carotid bruits.  Carotid, radial, femoral, DP and PT pulses normal and equal.   Pulmonary:     Effort: Pulmonary effort is normal.     Breath sounds: Normal breath sounds and air entry.  Chest:  Breasts:    Right: No inverted nipple, mass or nipple discharge.     Left: No inverted nipple, mass or nipple discharge.  Abdominal:     General: Bowel sounds are normal.     Palpations: Abdomen is soft. There is no hepatomegaly, splenomegaly or mass.     Tenderness: There is no abdominal tenderness.     Hernia: No hernia is present.  Genitourinary:    Comments: Normal external female genitalia Atrophy of vaginal and cervical mucosa No uterine or adnexal mass or tenderness.   Musculoskeletal:     Cervical back: Normal range of motion and neck supple.     Right lower leg: No edema.     Left lower leg: No edema.     Comments: Unable to fully extend left elbow.  + tinels over medial epicondyle, left elbow.  Feet:     Right foot:     Skin integrity: Dry skin present.     Toenail Condition: Right toenails are abnormally thick. Fungal disease present.    Left foot:     Skin integrity: Dry skin present.     Toenail Condition: Left toenails are abnormally thick. Fungal disease present. Lymphadenopathy:     Head:     Right side of head: No submental or submandibular adenopathy.     Left side of head: No submental or submandibular adenopathy.     Cervical: No cervical adenopathy.     Upper Body:     Right upper body: No supraclavicular or axillary adenopathy.     Left upper body: No supraclavicular or  axillary adenopathy.     Lower Body: No right inguinal adenopathy. No left inguinal adenopathy.  Skin:    General: Skin is warm.     Capillary Refill: Capillary refill takes less than 2 seconds.     Findings: No rash.  Neurological:     General: No focal deficit present.     Mental Status: She is alert and oriented to person, place, and time.     Cranial Nerves: Cranial nerves 2-12 are intact.     Sensory: Sensation is intact.     Motor: Motor function is intact.     Coordination: Coordination is intact.     Gait: Gait is intact.     Deep Tendon Reflexes: Reflexes are normal and symmetric.  Psychiatric:        Speech: Speech normal.        Behavior: Behavior normal. Behavior is cooperative.      Assessment & Plan   CPE with pap and HPV with history of + HPV 1 year ago Mammogram Return FIT in 2 weeks. Influenza and Shingles vaccines Not interested in COVID today--will return in 2-4 weeks for that.  2.  Left elbow and ulnar neuropathy:  considering PT in [place].   [place] to see about treatment of mold under home.  3.  Anxiety:  Referral to [place], LCSW-A.  Also case management for Medicaid application.  4.  Recurrent microcytic anemia and more recent report of possible melena--being sent back to GI by hematology.  5.  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  6.  Toenail onychomycosis and tinea pedis:  Terbinafine 250 mg daily for 84 days.  Shoe and shower floor treatment discussed.  Follow up hepatic prof in 6 and 12 weeks.    7.  Hypertension:  controlled

## 2022-06-28 LAB — CYTOLOGY - PAP

## 2022-07-15 DIAGNOSIS — K219 Gastro-esophageal reflux disease without esophagitis: Secondary | ICD-10-CM | POA: Insufficient documentation

## 2022-07-15 DIAGNOSIS — B353 Tinea pedis: Secondary | ICD-10-CM | POA: Insufficient documentation

## 2022-07-15 DIAGNOSIS — F32A Depression, unspecified: Secondary | ICD-10-CM | POA: Insufficient documentation

## 2022-07-15 DIAGNOSIS — B351 Tinea unguium: Secondary | ICD-10-CM | POA: Insufficient documentation

## 2022-07-15 DIAGNOSIS — I10 Essential (primary) hypertension: Secondary | ICD-10-CM | POA: Insufficient documentation

## 2022-07-15 DIAGNOSIS — H547 Unspecified visual loss: Secondary | ICD-10-CM | POA: Insufficient documentation

## 2022-07-15 NOTE — Addendum Note (Signed)
Addended by: Marcelino Duster on: 07/15/2022 05:00 PM   Modules accepted: Orders

## 2022-07-16 ENCOUNTER — Ambulatory Visit: Payer: Self-pay | Admitting: Internal Medicine

## 2022-08-08 ENCOUNTER — Other Ambulatory Visit: Payer: Self-pay

## 2022-08-13 ENCOUNTER — Telehealth: Payer: Self-pay

## 2022-08-13 ENCOUNTER — Other Ambulatory Visit: Payer: Self-pay | Admitting: Obstetrics and Gynecology

## 2022-08-13 DIAGNOSIS — Z1231 Encounter for screening mammogram for malignant neoplasm of breast: Secondary | ICD-10-CM

## 2022-08-13 NOTE — Telephone Encounter (Signed)
Telephoned patient at mobile number. Left a voice message with [facility] contact information.

## 2022-08-30 ENCOUNTER — Telehealth: Payer: Self-pay

## 2022-08-30 DIAGNOSIS — D509 Iron deficiency anemia, unspecified: Secondary | ICD-10-CM

## 2022-08-30 NOTE — Telephone Encounter (Signed)
Called and LVM regarding upcoming appts. Pt is scheduled for lab followed by MD appt on 3/11. Asked if Pt is willing to come in for labs today so iron labs will have time to result before MD visit. Asked for call back.

## 2022-09-02 ENCOUNTER — Inpatient Hospital Stay: Payer: No Typology Code available for payment source | Admitting: Hematology and Oncology

## 2022-09-02 ENCOUNTER — Inpatient Hospital Stay: Payer: No Typology Code available for payment source

## 2022-09-02 NOTE — Assessment & Plan Note (Deleted)
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 Candis Schatz with gastroenterology to evaluate her for GI bleeding.   Her sister accompanied her also appears to be iron deficient because she is also craving ice chips.   Return to clinic in 4 months with labs and follow-up.

## 2022-09-19 ENCOUNTER — Telehealth: Payer: Self-pay

## 2022-09-19 ENCOUNTER — Other Ambulatory Visit: Payer: Self-pay

## 2022-09-19 DIAGNOSIS — B351 Tinea unguium: Secondary | ICD-10-CM

## 2022-09-19 NOTE — Telephone Encounter (Signed)
Need to call [facility] to ask about her terbinafine prescription. Patient has not yet received it.

## 2022-09-23 NOTE — Telephone Encounter (Signed)
[facility] does not have terbinafine available. We need to send Rx to a different pharmacy.

## 2022-10-01 ENCOUNTER — Ambulatory Visit: Payer: No Typology Code available for payment source

## 2022-10-02 ENCOUNTER — Other Ambulatory Visit: Payer: Self-pay

## 2022-10-02 MED ORDER — TERBINAFINE HCL 250 MG PO TABS: 250.0000 mg | ORAL_TABLET | Freq: Every day | ORAL | 0 refills | Status: DC

## 2022-10-02 NOTE — Telephone Encounter (Signed)
Medication has been sent to a different pharmacy. Patient will come by tomorrow to pick up a goodrx coupon

## 2022-10-15 ENCOUNTER — Ambulatory Visit: Payer: No Typology Code available for payment source

## 2022-10-18 ENCOUNTER — Telehealth: Payer: Self-pay | Admitting: Hematology and Oncology

## 2022-10-18 NOTE — Telephone Encounter (Signed)
Patient left voicemail to see if Joan [name] could see her sooner on Monday reached out to patient,moved her up sooner in the day. Patient aware.

## 2022-10-20 NOTE — Assessment & Plan Note (Signed)
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   Return to clinic in 4 months with labs and follow-up.

## 2022-10-21 ENCOUNTER — Inpatient Hospital Stay: Payer: No Typology Code available for payment source

## 2022-10-21 ENCOUNTER — Inpatient Hospital Stay: Payer: No Typology Code available for payment source | Admitting: Hematology and Oncology

## 2022-10-21 ENCOUNTER — Inpatient Hospital Stay: Payer: No Typology Code available for payment source | Attending: Hematology and Oncology

## 2022-10-21 ENCOUNTER — Inpatient Hospital Stay (HOSPITAL_BASED_OUTPATIENT_CLINIC_OR_DEPARTMENT_OTHER): Payer: No Typology Code available for payment source | Admitting: Hematology and Oncology

## 2022-10-21 ENCOUNTER — Other Ambulatory Visit: Payer: Self-pay

## 2022-10-21 VITALS — BP 136/76 | HR 92 | Temp 97.7°F | Resp 18 | Ht 61.25 in | Wt 134.1 lb

## 2022-10-21 DIAGNOSIS — D500 Iron deficiency anemia secondary to blood loss (chronic): Secondary | ICD-10-CM | POA: Insufficient documentation

## 2022-10-21 DIAGNOSIS — D509 Iron deficiency anemia, unspecified: Secondary | ICD-10-CM

## 2022-10-21 DIAGNOSIS — Z79899 Other long term (current) drug therapy: Secondary | ICD-10-CM | POA: Insufficient documentation

## 2022-10-21 DIAGNOSIS — Z7982 Long term (current) use of aspirin: Secondary | ICD-10-CM | POA: Insufficient documentation

## 2022-10-21 LAB — IRON AND IRON BINDING CAPACITY (CC-WL,HP ONLY)
Iron: 60 ug/dL (ref 28–170)
Saturation Ratios: 12 % (ref 10.4–31.8)
TIBC: 515 ug/dL — ABNORMAL HIGH (ref 250–450)
UIBC: 455 ug/dL — ABNORMAL HIGH (ref 148–442)

## 2022-10-21 LAB — CBC WITH DIFFERENTIAL (CANCER CENTER ONLY)
Abs Immature Granulocytes: 0.01 10*3/uL (ref 0.00–0.07)
Basophils Absolute: 0.1 10*3/uL (ref 0.0–0.1)
Basophils Relative: 1 %
Eosinophils Absolute: 0.4 10*3/uL (ref 0.0–0.5)
Eosinophils Relative: 6 %
HCT: 31.9 % — ABNORMAL LOW (ref 36.0–46.0)
Hemoglobin: 10.1 g/dL — ABNORMAL LOW (ref 12.0–15.0)
Immature Granulocytes: 0 %
Lymphocytes Relative: 21 %
Lymphs Abs: 1.3 10*3/uL (ref 0.7–4.0)
MCH: 25.7 pg — ABNORMAL LOW (ref 26.0–34.0)
MCHC: 31.7 g/dL (ref 30.0–36.0)
MCV: 81.2 fL (ref 80.0–100.0)
Monocytes Absolute: 0.5 10*3/uL (ref 0.1–1.0)
Monocytes Relative: 8 %
Neutro Abs: 3.8 10*3/uL (ref 1.7–7.7)
Neutrophils Relative %: 64 %
Platelet Count: 323 10*3/uL (ref 150–400)
RBC: 3.93 MIL/uL (ref 3.87–5.11)
RDW: 15.4 % (ref 11.5–15.5)
WBC Count: 6 10*3/uL (ref 4.0–10.5)
nRBC: 0 % (ref 0.0–0.2)

## 2022-10-21 LAB — FERRITIN: Ferritin: 3 ng/mL — ABNORMAL LOW (ref 11–307)

## 2022-10-21 NOTE — Progress Notes (Signed)
Patient Care Team: Julieanne Manson, MD as PCP - General (Internal Medicine) Maisie Fus, MD as PCP - Cardiology (Cardiology)  DIAGNOSIS:  Encounter Diagnosis  Name Primary?   Iron deficiency anemia, unspecified iron deficiency anemia type Yes     CHIEF COMPLIANT: Follow-up of IDA   INTERVAL HISTORY: Joan [name] is a 57 y.o. with above-mentioned history of IDA. She presents to the clinic today for follow-up. She reports that she does stay tired a lot, but she has been doing ok. She still having craving for ice. She says after the iron infusion it did subside. She denies having black in stool.   ALLERGIES:  has No Known Allergies.  MEDICATIONS:  Current Outpatient Medications  Medication Sig Dispense Refill   amLODipine (NORVASC) 10 MG tablet Take 1 tablet (10 mg total) by mouth daily. 90 tablet 3   aspirin 325 MG tablet Take 81 mg by mouth daily.     lisinopril (ZESTRIL) 5 MG tablet 1/2 tab by mouth daily 45 tablet 3   omeprazole (PRILOSEC) 20 MG capsule Take 20 mg by mouth daily. (Patient not taking: Reported on 06/27/2022)     pantoprazole (PROTONIX) 40 MG tablet Take 1 tablet (40 mg total) by mouth daily. 30 tablet 1   sertraline (ZOLOFT) 100 MG tablet 1/2 tab by mouth daily for 7 days then 1 tab by mouth daily (Patient not taking: Reported on 06/27/2022) 30 tablet 1   sertraline (ZOLOFT) 100 MG tablet Take 1 tablet (100 mg total) by mouth daily. 90 tablet 3   terbinafine (LAMISIL) 250 MG tablet Take 1 tablet (250 mg total) by mouth daily. 84 tablet 0   No current facility-administered medications for this visit.    PHYSICAL EXAMINATION: ECOG PERFORMANCE STATUS: 1 - Symptomatic but completely ambulatory  Vitals:   10/21/22 1126  BP: 136/76  Pulse: 92  Resp: 18  Temp: 97.7 F (36.5 C)  SpO2: 99%   Filed Weights   10/21/22 1126  Weight: 134 lb 1.6 oz (60.8 kg)    LABORATORY DATA:  I have reviewed the data as listed    Latest Ref Rng & Units 03/04/2022     9:23 AM 04/19/2021    1:57 AM 04/17/2021   12:55 PM  CMP  Glucose 70 - 99 mg/dL 89  161  096   BUN 6 - 24 mg/dL 12  7  8    Creatinine 0.57 - 1.00 mg/dL 0.45  4.09  8.11   Sodium 134 - 144 mmol/L 138  135  136   Potassium 3.5 - 5.2 mmol/L 4.1  3.7  3.7   Chloride 96 - 106 mmol/L 102  107  103   CO2 20 - 29 mmol/L 22  22  22    Calcium 8.7 - 10.2 mg/dL 8.8  8.4  9.0   Total Protein 6.0 - 8.5 g/dL 6.5     Total Bilirubin 0.0 - 1.2 mg/dL 0.4     Alkaline Phos 44 - 121 IU/L 104     AST 0 - 40 IU/L 20     ALT 0 - 32 IU/L 15       Lab Results  Component Value Date   WBC 6.0 10/21/2022   HGB 10.1 (L) 10/21/2022   HCT 31.9 (L) 10/21/2022   MCV 81.2 10/21/2022   PLT 323 10/21/2022   NEUTROABS 3.8 10/21/2022    ASSESSMENT & PLAN:  Iron deficiency anemia IDA due to GI Bleed: EGD: unremarkable, Capsule endoscopy Neg,  Hospitalization: 04/17/21-04/19/21 Biopsies for H.Pylori: Neg, No celiac changes   IV iron: November 2022, October 2023   Lab review: 04/17/2021: Hemoglobin 5.7 (hospitalization)  06/05/2021: Hemoglobin 14.4 09/03/2021: Hemoglobin 14.7 03/04/2022: Hemoglobin 8.9, MCV 83, creatinine 0.87 04/08/2022: Hemoglobin 8.8, MCV 70.7 10/21/22: Hemoglobin 10.1, MCV 81.2, iron studies: Iron saturation 12%, TIBC 515, ferritin pending   She currently has cravings for ice chips. I will call her with the result of the ferritin and determine if she would benefit from additional IV iron therapy.    No orders of the defined types were placed in this encounter.  The patient has a good understanding of the overall plan. she agrees with it. she will call with any problems that may develop before the next visit here. Total time spent: 30 mins including face to face time and time spent for planning, charting and co-ordination of care   Tamsen Meek, MD 10/21/22    I Janan Ridge am acting as a Neurosurgeon for [employer]  I have reviewed the above documentation for accuracy  and completeness, and I agree with the above.

## 2022-10-22 ENCOUNTER — Other Ambulatory Visit: Payer: Self-pay | Admitting: Hematology and Oncology

## 2022-10-22 DIAGNOSIS — D509 Iron deficiency anemia, unspecified: Secondary | ICD-10-CM

## 2022-10-22 NOTE — Progress Notes (Signed)
I left a voicemail for the patient that based upon the iron studies says she has a profound iron deficiency and could benefit from IV iron therapy.  I will request our schedulers to set her up for 3 doses of IV iron.

## 2022-10-28 ENCOUNTER — Telehealth: Payer: Self-pay | Admitting: Hematology and Oncology

## 2022-10-28 NOTE — Telephone Encounter (Signed)
Scheduled appointment per scheduling message. Left voicemail.  

## 2022-10-31 ENCOUNTER — Inpatient Hospital Stay: Payer: No Typology Code available for payment source | Attending: Hematology and Oncology

## 2022-10-31 ENCOUNTER — Other Ambulatory Visit: Payer: Self-pay

## 2022-10-31 VITALS — BP 140/80 | HR 65 | Temp 98.1°F | Resp 16

## 2022-10-31 DIAGNOSIS — D500 Iron deficiency anemia secondary to blood loss (chronic): Secondary | ICD-10-CM | POA: Insufficient documentation

## 2022-10-31 DIAGNOSIS — Z79899 Other long term (current) drug therapy: Secondary | ICD-10-CM | POA: Insufficient documentation

## 2022-10-31 DIAGNOSIS — D509 Iron deficiency anemia, unspecified: Secondary | ICD-10-CM

## 2022-10-31 MED ORDER — SODIUM CHLORIDE 0.9 % IV SOLN
300.0000 mg | Freq: Once | INTRAVENOUS | Status: AC
  Administered 2022-10-31: 300 mg via INTRAVENOUS
  Filled 2022-10-31: qty 300

## 2022-10-31 MED ORDER — SODIUM CHLORIDE 0.9 % IV SOLN: Freq: Once | INTRAVENOUS | Status: AC

## 2022-10-31 NOTE — Patient Instructions (Signed)

## 2022-10-31 NOTE — Progress Notes (Signed)
Patient tolerated iron infusion well with no adverse symptoms noted. Vital signs stable at time of discharge. Observed for only 10 minutes post infusion, declined full 30 minute observation. Ambulated independently for discharge.

## 2022-11-07 ENCOUNTER — Inpatient Hospital Stay: Payer: No Typology Code available for payment source

## 2022-11-13 ENCOUNTER — Telehealth: Payer: Self-pay | Admitting: Hematology and Oncology

## 2022-11-13 NOTE — Telephone Encounter (Signed)
Scheduled appointment per scheduling message. Left voicemail.  

## 2022-11-15 ENCOUNTER — Inpatient Hospital Stay: Payer: No Typology Code available for payment source

## 2022-11-15 VITALS — BP 138/88 | HR 73 | Resp 18

## 2022-11-15 DIAGNOSIS — D509 Iron deficiency anemia, unspecified: Secondary | ICD-10-CM

## 2022-11-15 MED ORDER — SODIUM CHLORIDE 0.9 % IV SOLN
300.0000 mg | Freq: Once | INTRAVENOUS | Status: AC
  Administered 2022-11-15: 300 mg via INTRAVENOUS
  Filled 2022-11-15: qty 300

## 2022-11-15 MED ORDER — SODIUM CHLORIDE 0.9 % IV SOLN: Freq: Once | INTRAVENOUS | Status: AC

## 2022-11-15 NOTE — Patient Instructions (Signed)

## 2022-11-26 ENCOUNTER — Inpatient Hospital Stay: Payer: No Typology Code available for payment source | Attending: Hematology and Oncology

## 2022-12-17 ENCOUNTER — Ambulatory Visit: Payer: No Typology Code available for payment source

## 2022-12-17 DIAGNOSIS — R87610 Atypical squamous cells of undetermined significance on cytologic smear of cervix (ASC-US): Secondary | ICD-10-CM

## 2022-12-20 ENCOUNTER — Telehealth: Payer: Self-pay | Admitting: Hematology and Oncology

## 2022-12-30 ENCOUNTER — Ambulatory Visit: Payer: Self-pay | Admitting: Internal Medicine

## 2023-02-10 ENCOUNTER — Inpatient Hospital Stay: Payer: No Typology Code available for payment source | Attending: Hematology and Oncology

## 2023-02-12 ENCOUNTER — Telehealth: Payer: Self-pay

## 2023-02-12 ENCOUNTER — Inpatient Hospital Stay: Payer: No Typology Code available for payment source | Admitting: Hematology and Oncology

## 2023-02-12 NOTE — Assessment & Plan Note (Signed)
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 I will call her with the result of the ferritin and determine if she would benefit from additional IV iron therapy.

## 2023-02-12 NOTE — Telephone Encounter (Signed)
Attempted to call pt to cancel and r/s MD telephone visit. Pt was to have labs 2 days before MD phone visit and was a no show for labs.   LVM for pt to return call to expain reason for cancelling. Message sent to scheduler to have pt r/s.

## 2023-02-18 ENCOUNTER — Telehealth: Payer: Self-pay | Admitting: Hematology and Oncology

## 2023-03-04 ENCOUNTER — Inpatient Hospital Stay: Payer: No Typology Code available for payment source | Attending: Hematology and Oncology

## 2023-03-04 DIAGNOSIS — D500 Iron deficiency anemia secondary to blood loss (chronic): Secondary | ICD-10-CM | POA: Insufficient documentation

## 2023-03-04 DIAGNOSIS — D509 Iron deficiency anemia, unspecified: Secondary | ICD-10-CM

## 2023-03-04 LAB — CBC WITH DIFFERENTIAL (CANCER CENTER ONLY)
Abs Immature Granulocytes: 0.02 10*3/uL (ref 0.00–0.07)
Basophils Absolute: 0 10*3/uL (ref 0.0–0.1)
Basophils Relative: 1 %
Eosinophils Absolute: 0.3 10*3/uL (ref 0.0–0.5)
Eosinophils Relative: 5 %
HCT: 31.1 % — ABNORMAL LOW (ref 36.0–46.0)
Hemoglobin: 8.8 g/dL — ABNORMAL LOW (ref 12.0–15.0)
Immature Granulocytes: 0 %
Lymphocytes Relative: 28 %
Lymphs Abs: 1.6 10*3/uL (ref 0.7–4.0)
MCH: 22.3 pg — ABNORMAL LOW (ref 26.0–34.0)
MCHC: 28.3 g/dL — ABNORMAL LOW (ref 30.0–36.0)
MCV: 78.9 fL — ABNORMAL LOW (ref 80.0–100.0)
Monocytes Absolute: 0.6 10*3/uL (ref 0.1–1.0)
Monocytes Relative: 10 %
Neutro Abs: 3.1 10*3/uL (ref 1.7–7.7)
Neutrophils Relative %: 56 %
Platelet Count: 385 10*3/uL (ref 150–400)
RBC: 3.94 MIL/uL (ref 3.87–5.11)
RDW: 16.6 % — ABNORMAL HIGH (ref 11.5–15.5)
WBC Count: 5.6 10*3/uL (ref 4.0–10.5)
nRBC: 0 % (ref 0.0–0.2)

## 2023-03-04 LAB — IRON AND IRON BINDING CAPACITY (CC-WL,HP ONLY)
Iron: 25 ug/dL — ABNORMAL LOW (ref 28–170)
Saturation Ratios: 4 % — ABNORMAL LOW (ref 10.4–31.8)
TIBC: 603 ug/dL — ABNORMAL HIGH (ref 250–450)
UIBC: 578 ug/dL — ABNORMAL HIGH (ref 148–442)

## 2023-03-05 LAB — FERRITIN: Ferritin: 3 ng/mL — ABNORMAL LOW (ref 11–307)

## 2023-03-06 ENCOUNTER — Inpatient Hospital Stay: Payer: No Typology Code available for payment source | Admitting: Adult Health

## 2023-03-06 DIAGNOSIS — D509 Iron deficiency anemia, unspecified: Secondary | ICD-10-CM
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
## 2023-03-06 NOTE — Assessment & Plan Note (Addendum)
Joan [name] is here today for follow-up of her iron deficiency anemia.  She requires further IV iron.  I recommended IV Venofer 300 mg weekly x 3 to start next week.  I also recommended her return in 12 weeks for labs only in 12 weeks and 2 days for follow-up to discuss those labs.  She denies any bleeding or any other concerns.  Will continue to monitor this closely and she knows to call for any questions or concerns that may arise between now and her next appointment.

## 2023-03-17 ENCOUNTER — Inpatient Hospital Stay: Payer: No Typology Code available for payment source

## 2023-03-24 ENCOUNTER — Inpatient Hospital Stay: Payer: No Typology Code available for payment source

## 2023-03-24 ENCOUNTER — Other Ambulatory Visit: Payer: Self-pay | Admitting: Hematology and Oncology

## 2023-03-24 VITALS — BP 132/78 | HR 72 | Temp 98.2°F | Resp 18

## 2023-03-24 DIAGNOSIS — D509 Iron deficiency anemia, unspecified: Secondary | ICD-10-CM

## 2023-03-24 MED ORDER — SODIUM CHLORIDE 0.9 % IV SOLN
300.0000 mg | Freq: Once | INTRAVENOUS | Status: AC
  Administered 2023-03-24: 300 mg via INTRAVENOUS
  Filled 2023-03-24: qty 300

## 2023-03-24 NOTE — Patient Instructions (Signed)

## 2023-03-24 NOTE — Progress Notes (Signed)
Patient declined 30 minute post iron infusion wait time. She was ambulatory to the lobby with no issues.

## 2023-03-28 ENCOUNTER — Encounter: Payer: Self-pay | Admitting: General Practice

## 2023-03-28 NOTE — Progress Notes (Signed)
[facility] Spiritual Care Note  Missed Joan [name] in infusion this week, so phoned to introduce Spiritual Care as part of her Patient and Family Support Team. Left voicemail encouraging return call.   [address] [place], [place], [facility] Pager [phone] Voicemail [phone]

## 2023-04-01 ENCOUNTER — Inpatient Hospital Stay: Payer: No Typology Code available for payment source

## 2023-04-01 ENCOUNTER — Other Ambulatory Visit: Payer: Self-pay | Admitting: Adult Health

## 2023-04-07 ENCOUNTER — Inpatient Hospital Stay: Payer: No Typology Code available for payment source

## 2023-04-07 ENCOUNTER — Inpatient Hospital Stay: Payer: No Typology Code available for payment source | Attending: Hematology and Oncology

## 2023-04-07 VITALS — BP 133/81 | HR 70 | Temp 98.8°F | Resp 18

## 2023-04-07 DIAGNOSIS — D509 Iron deficiency anemia, unspecified: Secondary | ICD-10-CM

## 2023-04-07 DIAGNOSIS — K922 Gastrointestinal hemorrhage, unspecified: Secondary | ICD-10-CM | POA: Insufficient documentation

## 2023-04-07 DIAGNOSIS — D500 Iron deficiency anemia secondary to blood loss (chronic): Secondary | ICD-10-CM | POA: Insufficient documentation

## 2023-04-07 MED ORDER — SODIUM CHLORIDE 0.9 % IV SOLN: Freq: Once | INTRAVENOUS | Status: AC

## 2023-04-07 MED ORDER — SODIUM CHLORIDE 0.9 % IV SOLN
300.0000 mg | Freq: Once | INTRAVENOUS | Status: AC
  Administered 2023-04-07: 300 mg via INTRAVENOUS
  Filled 2023-04-07: qty 300

## 2023-04-07 NOTE — Patient Instructions (Signed)
Iron Sucrose Injection What is this medication? IRON SUCROSE (EYE ern SOO krose) treats low levels of iron (iron deficiency anemia) in people with kidney disease. Iron is a mineral that plays an important role in making red blood cells, which carry oxygen from your lungs to the rest of your body. This medicine may be used for other purposes; ask your health care provider or pharmacist if you have questions. COMMON BRAND NAME(S): Venofer What should I tell my care team before I take this medication? They need to know if you have any of these conditions: Anemia not caused by low iron levels Heart disease High levels of iron in the blood Kidney disease Liver disease An unusual or allergic reaction to iron, other medications, foods, dyes, or preservatives Pregnant or trying to get pregnant Breastfeeding How should I use this medication? This medication is for infusion into a vein. It is given in a hospital or clinic setting. Talk to your care team about the use of this medication in children. While this medication may be prescribed for children as young as 2 years for selected conditions, precautions do apply. Overdosage: If you think you have taken too much of this medicine contact a poison control center or emergency room at once. NOTE: This medicine is only for you. Do not share this medicine with others. What if I miss a dose? Keep appointments for follow-up doses. It is important not to miss your dose. Call your care team if you are unable to keep an appointment. What may interact with this medication? Do not take this medication with any of the following: Deferoxamine Dimercaprol Other iron products This medication may also interact with the following: Chloramphenicol Deferasirox This list may not describe all possible interactions. Give your health care provider a list of all the medicines, herbs, non-prescription drugs, or dietary supplements you use. Also tell them if you smoke,  drink alcohol, or use illegal drugs. Some items may interact with your medicine. What should I watch for while using this medication? Visit your care team regularly. Tell your care team if your symptoms do not start to get better or if they get worse. You may need blood work done while you are taking this medication. You may need to follow a special diet. Talk to your care team. Foods that contain iron include: whole grains/cereals, dried fruits, beans, or peas, leafy green vegetables, and organ meats (liver, kidney). What side effects may I notice from receiving this medication? Side effects that you should report to your care team as soon as possible: Allergic reactions--skin rash, itching, hives, swelling of the face, lips, tongue, or throat Low blood pressure--dizziness, feeling faint or lightheaded, blurry vision Shortness of breath Side effects that usually do not require medical attention (report to your care team if they continue or are bothersome): Flushing Headache Joint pain Muscle pain Nausea Pain, redness, or irritation at injection site This list may not describe all possible side effects. Call your doctor for medical advice about side effects. You may report side effects to FDA at 1-800-FDA-1088. Where should I keep my medication? This medication is given in a hospital or clinic. It will not be stored at home. NOTE: This sheet is a summary. It may not cover all possible information. If you have questions about this medicine, talk to your doctor, pharmacist, or health care provider.  2024 Elsevier/Gold Standard (2022-11-15 00:00:00)

## 2023-04-07 NOTE — Progress Notes (Signed)
Pt declined 30 minute observation following Venofer infusion. Pt tolerated Tx well w/out incident. VSS at discharge.  Ambulatory to lobby.

## 2023-04-28 ENCOUNTER — Ambulatory Visit: Payer: Self-pay | Admitting: Internal Medicine

## 2023-05-05 ENCOUNTER — Encounter: Payer: Self-pay | Admitting: Internal Medicine

## 2023-05-12 ENCOUNTER — Telehealth: Payer: Self-pay

## 2023-05-20 ENCOUNTER — Other Ambulatory Visit: Payer: Self-pay

## 2023-05-20 MED ORDER — LISINOPRIL 5 MG PO TABS: ORAL_TABLET | ORAL | 0 refills | Status: DC

## 2023-05-20 MED ORDER — SERTRALINE HCL 100 MG PO TABS: 100.0000 mg | ORAL_TABLET | Freq: Every day | ORAL | 0 refills | Status: DC

## 2023-05-20 MED ORDER — AMLODIPINE BESYLATE 10 MG PO TABS: 10.0000 mg | ORAL_TABLET | Freq: Every day | ORAL | 0 refills | Status: DC

## 2023-05-20 NOTE — Telephone Encounter (Signed)
Rx refilled.

## 2023-05-27 ENCOUNTER — Inpatient Hospital Stay: Payer: No Typology Code available for payment source

## 2023-05-27 ENCOUNTER — Ambulatory Visit: Payer: No Typology Code available for payment source | Admitting: Hematology and Oncology

## 2023-05-27 ENCOUNTER — Telehealth: Payer: Self-pay | Admitting: Hematology and Oncology

## 2023-05-29 ENCOUNTER — Inpatient Hospital Stay: Payer: No Typology Code available for payment source | Attending: Hematology and Oncology

## 2023-05-29 DIAGNOSIS — K922 Gastrointestinal hemorrhage, unspecified: Secondary | ICD-10-CM | POA: Insufficient documentation

## 2023-05-29 DIAGNOSIS — D500 Iron deficiency anemia secondary to blood loss (chronic): Secondary | ICD-10-CM | POA: Insufficient documentation

## 2023-05-29 DIAGNOSIS — D509 Iron deficiency anemia, unspecified: Secondary | ICD-10-CM

## 2023-05-29 LAB — CBC WITH DIFFERENTIAL (CANCER CENTER ONLY)
Abs Immature Granulocytes: 0.01 10*3/uL (ref 0.00–0.07)
Basophils Absolute: 0.1 10*3/uL (ref 0.0–0.1)
Basophils Relative: 1 %
Eosinophils Absolute: 0.2 10*3/uL (ref 0.0–0.5)
Eosinophils Relative: 3 %
HCT: 37.7 % (ref 36.0–46.0)
Hemoglobin: 11.6 g/dL — ABNORMAL LOW (ref 12.0–15.0)
Immature Granulocytes: 0 %
Lymphocytes Relative: 37 %
Lymphs Abs: 2 10*3/uL (ref 0.7–4.0)
MCH: 24.8 pg — ABNORMAL LOW (ref 26.0–34.0)
MCHC: 30.8 g/dL (ref 30.0–36.0)
MCV: 80.7 fL (ref 80.0–100.0)
Monocytes Absolute: 0.4 10*3/uL (ref 0.1–1.0)
Monocytes Relative: 8 %
Neutro Abs: 2.7 10*3/uL (ref 1.7–7.7)
Neutrophils Relative %: 51 %
Platelet Count: 306 10*3/uL (ref 150–400)
RBC: 4.67 MIL/uL (ref 3.87–5.11)
RDW: 21.8 % — ABNORMAL HIGH (ref 11.5–15.5)
WBC Count: 5.3 10*3/uL (ref 4.0–10.5)
nRBC: 0 % (ref 0.0–0.2)

## 2023-05-29 LAB — IRON AND IRON BINDING CAPACITY (CC-WL,HP ONLY)
Iron: 152 ug/dL (ref 28–170)
Saturation Ratios: 30 % (ref 10.4–31.8)
TIBC: 507 ug/dL — ABNORMAL HIGH (ref 250–450)
UIBC: 355 ug/dL

## 2023-05-30 LAB — FERRITIN: Ferritin: 6 ng/mL — ABNORMAL LOW (ref 11–307)

## 2023-06-03 ENCOUNTER — Inpatient Hospital Stay: Payer: No Typology Code available for payment source | Admitting: Hematology and Oncology

## 2023-06-03 NOTE — Assessment & Plan Note (Signed)
IDA due to GI Bleed: EGD: unremarkable, Capsule endoscopy Neg,   Hospitalization: 04/17/21-04/19/21 Biopsies for H.Pylori: Neg, No celiac changes   IV iron: November 2022, October 2023, May 2024, September 2024   Lab review: 04/17/2021: Hemoglobin 5.7 (hospitalization)  06/05/2021: Hemoglobin 14.4 09/03/2021: Hemoglobin 14.7 03/04/2022: Hemoglobin 8.9, MCV 83, creatinine 0.87 04/08/2022: Hemoglobin 8.8, MCV 70.7 10/21/22: Hemoglobin 10.1, MCV 81.2, iron studies: Iron saturation 12%, TIBC 515, ferritin 3 05/29/2023: Hemoglobin 11.6, MCV 80.7, iron saturation 30%, ferritin 6, TIBC 507   Her ferritin has not improved in spite of repeated IV iron infusions.  However she does not seem to get all 3 doses of IV iron at any of her previous infusions.  Usually she gets only 2 out of 3 infusions.  I recommended 3 doses of IV Venofer.

## 2023-07-02 ENCOUNTER — Inpatient Hospital Stay: Payer: No Typology Code available for payment source

## 2023-07-02 ENCOUNTER — Inpatient Hospital Stay
Payer: No Typology Code available for payment source | Attending: Hematology and Oncology | Admitting: Hematology and Oncology

## 2023-07-02 VITALS — BP 128/70 | HR 97 | Temp 97.7°F | Resp 18 | Ht 61.25 in | Wt 142.6 lb

## 2023-07-02 DIAGNOSIS — Z7982 Long term (current) use of aspirin: Secondary | ICD-10-CM | POA: Insufficient documentation

## 2023-07-02 DIAGNOSIS — Z79899 Other long term (current) drug therapy: Secondary | ICD-10-CM | POA: Insufficient documentation

## 2023-07-02 DIAGNOSIS — D500 Iron deficiency anemia secondary to blood loss (chronic): Secondary | ICD-10-CM | POA: Insufficient documentation

## 2023-07-02 DIAGNOSIS — D509 Iron deficiency anemia, unspecified: Secondary | ICD-10-CM

## 2023-07-02 DIAGNOSIS — R5383 Other fatigue: Secondary | ICD-10-CM | POA: Insufficient documentation

## 2023-07-02 DIAGNOSIS — R0609 Other forms of dyspnea: Secondary | ICD-10-CM | POA: Insufficient documentation

## 2023-07-02 DIAGNOSIS — K922 Gastrointestinal hemorrhage, unspecified: Secondary | ICD-10-CM | POA: Insufficient documentation

## 2023-07-02 LAB — CBC WITH DIFFERENTIAL (CANCER CENTER ONLY)
Abs Immature Granulocytes: 0.01 10*3/uL (ref 0.00–0.07)
Basophils Absolute: 0 10*3/uL (ref 0.0–0.1)
Basophils Relative: 1 %
Eosinophils Absolute: 0.1 10*3/uL (ref 0.0–0.5)
Eosinophils Relative: 2 %
HCT: 28 % — ABNORMAL LOW (ref 36.0–46.0)
Hemoglobin: 8.7 g/dL — ABNORMAL LOW (ref 12.0–15.0)
Immature Granulocytes: 0 %
Lymphocytes Relative: 19 %
Lymphs Abs: 0.8 10*3/uL (ref 0.7–4.0)
MCH: 25.6 pg — ABNORMAL LOW (ref 26.0–34.0)
MCHC: 31.1 g/dL (ref 30.0–36.0)
MCV: 82.4 fL (ref 80.0–100.0)
Monocytes Absolute: 0.5 10*3/uL (ref 0.1–1.0)
Monocytes Relative: 12 %
Neutro Abs: 2.8 10*3/uL (ref 1.7–7.7)
Neutrophils Relative %: 66 %
Platelet Count: 369 10*3/uL (ref 150–400)
RBC: 3.4 MIL/uL — ABNORMAL LOW (ref 3.87–5.11)
RDW: 17.2 % — ABNORMAL HIGH (ref 11.5–15.5)
WBC Count: 4.2 10*3/uL (ref 4.0–10.5)
nRBC: 0.5 % — ABNORMAL HIGH (ref 0.0–0.2)

## 2023-07-02 LAB — SAMPLE TO BLOOD BANK

## 2023-07-02 LAB — IRON AND IRON BINDING CAPACITY (CC-WL,HP ONLY)
Iron: 15 ug/dL — ABNORMAL LOW (ref 28–170)
Saturation Ratios: 3 % — ABNORMAL LOW (ref 10.4–31.8)
TIBC: 545 ug/dL — ABNORMAL HIGH (ref 250–450)
UIBC: 530 ug/dL — ABNORMAL HIGH (ref 148–442)

## 2023-07-02 LAB — FERRITIN: Ferritin: 4 ng/mL — ABNORMAL LOW (ref 11–307)

## 2023-07-02 NOTE — Progress Notes (Signed)
 Patient Care Team: Adella Norris, MD as PCP - General (Internal Medicine) Alvan Ronal BRAVO, MD as PCP - Cardiology (Cardiology)  DIAGNOSIS:  Encounter Diagnosis  Name Primary?   Iron  deficiency anemia, unspecified iron  deficiency anemia type Yes    CHIEF COMPLIANT: Follow-up of iron  deficiency anemia  HISTORY OF PRESENT ILLNESS:  History of Present Illness   The patient, with a history of iron  deficiency anemia, presents with fatigue and dyspnea on exertion. She reports a craving for ice and a decrease in her oxygen levels. She was previously treated with iron  infusions, but missed one dose due to a family emergency. She reports that her symptoms have worsened since her last iron  infusion, with increased fatigue and shortness of breath even with minimal exertion, such as walking from the car to the clinic.         ALLERGIES:  has no known allergies.  MEDICATIONS:  Current Outpatient Medications  Medication Sig Dispense Refill   amLODipine  (NORVASC ) 10 MG tablet Take 1 tablet (10 mg total) by mouth daily. 90 tablet 0   aspirin  325 MG tablet Take 81 mg by mouth daily.     lisinopril  (ZESTRIL ) 5 MG tablet 1/2 tab by mouth daily 45 tablet 0   omeprazole  (PRILOSEC) 20 MG capsule Take 20 mg by mouth daily. (Patient not taking: Reported on 06/27/2022)     pantoprazole  (PROTONIX ) 40 MG tablet Take 1 tablet (40 mg total) by mouth daily. 30 tablet 1   sertraline  (ZOLOFT ) 100 MG tablet Take 1 tablet (100 mg total) by mouth daily. 90 tablet 0   terbinafine  (LAMISIL ) 250 MG tablet Take 1 tablet (250 mg total) by mouth daily. 84 tablet 0   No current facility-administered medications for this visit.    PHYSICAL EXAMINATION: ECOG PERFORMANCE STATUS: 1 - Symptomatic but completely ambulatory  Vitals:   07/02/23 1049  BP: 128/70  Pulse: 97  Resp: 18  Temp: 97.7 F (36.5 C)  SpO2: 93%   Filed Weights   07/02/23 1049  Weight: 142 lb 9.6 oz (64.7 kg)      LABORATORY DATA:  I  have reviewed the data as listed    Latest Ref Rng & Units 03/04/2022    9:23 AM 04/19/2021    1:57 AM 04/17/2021   12:55 PM  CMP  Glucose 70 - 99 mg/dL 89  893  892   BUN 6 - 24 mg/dL 12  7  8    Creatinine 0.57 - 1.00 mg/dL 9.12  8.75  8.69   Sodium 134 - 144 mmol/L 138  135  136   Potassium 3.5 - 5.2 mmol/L 4.1  3.7  3.7   Chloride 96 - 106 mmol/L 102  107  103   CO2 20 - 29 mmol/L 22  22  22    Calcium 8.7 - 10.2 mg/dL 8.8  8.4  9.0   Total Protein 6.0 - 8.5 g/dL 6.5     Total Bilirubin 0.0 - 1.2 mg/dL 0.4     Alkaline Phos 44 - 121 IU/L 104     AST 0 - 40 IU/L 20     ALT 0 - 32 IU/L 15       Lab Results  Component Value Date   WBC 5.3 05/29/2023   HGB 11.6 (L) 05/29/2023   HCT 37.7 05/29/2023   MCV 80.7 05/29/2023   PLT 306 05/29/2023   NEUTROABS 2.7 05/29/2023    ASSESSMENT & PLAN:  Iron  deficiency anemia IDA due to GI  Bleed: EGD: unremarkable, Capsule endoscopy Neg,   Hospitalization: 04/17/21-04/19/21 Biopsies for H.Pylori: Neg, No celiac changes   IV iron : November 2022, October 2023, September 2024   Lab review: 04/17/2021: Hemoglobin 5.7 (hospitalization)  06/05/2021: Hemoglobin 14.4 09/03/2021: Hemoglobin 14.7 03/04/2022: Hemoglobin 8.9, MCV 83, creatinine 0.87 04/08/2022: Hemoglobin 8.8, MCV 70.7 10/21/22: Hemoglobin 10.1, MCV 81.2, iron  studies: Iron  saturation 12%, TIBC 515, ferritin 3 05/29/2023: Hemoglobin 11.6, MCV 80.7, TIBC 507, ferritin 6   She currently has cravings for ice chips. Patient will need 3 doses of IV iron .  Recheck labs in 3 months and telephone visit 2 days later to discuss results ------------------------------------- Assessment and Plan    Iron  Deficiency Anemia Recurrent symptoms of fatigue and dyspnea on exertion. Iron  deficiency confirmed in December. Partial iron  infusion treatment in September, but one dose was missed. -Schedule three doses of iron  infusion. -Check blood work today to assess if blood transfusion is  needed. -Call patient with lab results and further plan.          Orders Placed This Encounter  Procedures   CBC with Differential (Cancer Center Only)    Standing Status:   Future    Expiration Date:   07/01/2024   Ferritin    Standing Status:   Future    Expiration Date:   07/01/2024   Iron  and Iron  Binding Capacity ([facility] only)    Standing Status:   Future    Expiration Date:   07/01/2024   Sample to Blood Bank    Standing Status:   Future    Expiration Date:   07/01/2024   The patient has a good understanding of the overall plan. she agrees with it. she will call with any problems that may develop before the next visit here. Total time spent: 30 mins including face to face time and time spent for planning, charting and co-ordination of care   Joan MARLA Chad, MD 07/02/23

## 2023-07-02 NOTE — Assessment & Plan Note (Signed)
 IDA due to GI Bleed: EGD: unremarkable, Capsule endoscopy Neg,   Hospitalization: 04/17/21-04/19/21 Biopsies for H.Pylori: Neg, No celiac changes   IV iron : November 2022, October 2023, September 2024   Lab review: 04/17/2021: Hemoglobin 5.7 (hospitalization)  06/05/2021: Hemoglobin 14.4 09/03/2021: Hemoglobin 14.7 03/04/2022: Hemoglobin 8.9, MCV 83, creatinine 0.87 04/08/2022: Hemoglobin 8.8, MCV 70.7 10/21/22: Hemoglobin 10.1, MCV 81.2, iron  studies: Iron  saturation 12%, TIBC 515, ferritin 3 05/29/2023: Hemoglobin 11.6, MCV 80.7, TIBC 507, ferritin 6   She currently has cravings for ice chips. Patient will need 3 doses of IV iron .  Recheck labs in 3 months and telephone visit 2 days later to discuss results

## 2023-07-03 ENCOUNTER — Encounter: Payer: Self-pay | Admitting: Hematology and Oncology

## 2023-07-09 ENCOUNTER — Inpatient Hospital Stay: Payer: No Typology Code available for payment source

## 2023-07-09 ENCOUNTER — Telehealth: Payer: Self-pay

## 2023-07-09 ENCOUNTER — Other Ambulatory Visit: Payer: No Typology Code available for payment source

## 2023-07-16 ENCOUNTER — Inpatient Hospital Stay: Payer: Self-pay

## 2023-07-16 VITALS — BP 113/63 | HR 83 | Temp 98.2°F | Resp 18

## 2023-07-16 DIAGNOSIS — D509 Iron deficiency anemia, unspecified: Secondary | ICD-10-CM

## 2023-07-16 MED ORDER — SODIUM CHLORIDE 0.9 % IV SOLN
300.0000 mg | Freq: Once | INTRAVENOUS | Status: AC
  Administered 2023-07-16: 300.0065 mg via INTRAVENOUS
  Filled 2023-07-16: qty 300

## 2023-07-16 MED ORDER — SODIUM CHLORIDE 0.9 % IV SOLN: INTRAVENOUS | Status: DC

## 2023-07-16 NOTE — Progress Notes (Signed)
 Pt declined 30 min post iron observation.

## 2023-07-16 NOTE — Patient Instructions (Signed)
Iron Sucrose Injection What is this medication? IRON SUCROSE (EYE ern SOO krose) treats low levels of iron (iron deficiency anemia) in people with kidney disease. Iron is a mineral that plays an important role in making red blood cells, which carry oxygen from your lungs to the rest of your body. This medicine may be used for other purposes; ask your health care provider or pharmacist if you have questions. COMMON BRAND NAME(S): Venofer What should I tell my care team before I take this medication? They need to know if you have any of these conditions: Anemia not caused by low iron levels Heart disease High levels of iron in the blood Kidney disease Liver disease An unusual or allergic reaction to iron, other medications, foods, dyes, or preservatives Pregnant or trying to get pregnant Breastfeeding How should I use this medication? This medication is for infusion into a vein. It is given in a hospital or clinic setting. Talk to your care team about the use of this medication in children. While this medication may be prescribed for children as young as 2 years for selected conditions, precautions do apply. Overdosage: If you think you have taken too much of this medicine contact a poison control center or emergency room at once. NOTE: This medicine is only for you. Do not share this medicine with others. What if I miss a dose? Keep appointments for follow-up doses. It is important not to miss your dose. Call your care team if you are unable to keep an appointment. What may interact with this medication? Do not take this medication with any of the following: Deferoxamine Dimercaprol Other iron products This medication may also interact with the following: Chloramphenicol Deferasirox This list may not describe all possible interactions. Give your health care provider a list of all the medicines, herbs, non-prescription drugs, or dietary supplements you use. Also tell them if you smoke,  drink alcohol, or use illegal drugs. Some items may interact with your medicine. What should I watch for while using this medication? Visit your care team regularly. Tell your care team if your symptoms do not start to get better or if they get worse. You may need blood work done while you are taking this medication. You may need to follow a special diet. Talk to your care team. Foods that contain iron include: whole grains/cereals, dried fruits, beans, or peas, leafy green vegetables, and organ meats (liver, kidney). What side effects may I notice from receiving this medication? Side effects that you should report to your care team as soon as possible: Allergic reactions--skin rash, itching, hives, swelling of the face, lips, tongue, or throat Low blood pressure--dizziness, feeling faint or lightheaded, blurry vision Shortness of breath Side effects that usually do not require medical attention (report to your care team if they continue or are bothersome): Flushing Headache Joint pain Muscle pain Nausea Pain, redness, or irritation at injection site This list may not describe all possible side effects. Call your doctor for medical advice about side effects. You may report side effects to FDA at 1-800-FDA-1088. Where should I keep my medication? This medication is given in a hospital or clinic. It will not be stored at home. NOTE: This sheet is a summary. It may not cover all possible information. If you have questions about this medicine, talk to your doctor, pharmacist, or health care provider.  2024 Elsevier/Gold Standard (2022-11-15 00:00:00)

## 2023-07-23 ENCOUNTER — Inpatient Hospital Stay: Payer: No Typology Code available for payment source

## 2023-07-23 VITALS — BP 123/73 | HR 86 | Temp 98.8°F | Resp 18

## 2023-07-23 DIAGNOSIS — D509 Iron deficiency anemia, unspecified: Secondary | ICD-10-CM

## 2023-07-23 MED ORDER — SODIUM CHLORIDE 0.9 % IV SOLN: INTRAVENOUS | Status: DC

## 2023-07-23 MED ORDER — SODIUM CHLORIDE 0.9 % IV SOLN
300.0000 mg | Freq: Once | INTRAVENOUS | Status: AC
  Administered 2023-07-23: 300 mg via INTRAVENOUS
  Filled 2023-07-23: qty 300

## 2023-07-23 NOTE — Patient Instructions (Signed)
Iron Sucrose Injection What is this medication? IRON SUCROSE (EYE ern SOO krose) treats low levels of iron (iron deficiency anemia) in people with kidney disease. Iron is a mineral that plays an important role in making red blood cells, which carry oxygen from your lungs to the rest of your body. This medicine may be used for other purposes; ask your health care provider or pharmacist if you have questions. COMMON BRAND NAME(S): Venofer What should I tell my care team before I take this medication? They need to know if you have any of these conditions: Anemia not caused by low iron levels Heart disease High levels of iron in the blood Kidney disease Liver disease An unusual or allergic reaction to iron, other medications, foods, dyes, or preservatives Pregnant or trying to get pregnant Breastfeeding How should I use this medication? This medication is for infusion into a vein. It is given in a hospital or clinic setting. Talk to your care team about the use of this medication in children. While this medication may be prescribed for children as young as 2 years for selected conditions, precautions do apply. Overdosage: If you think you have taken too much of this medicine contact a poison control center or emergency room at once. NOTE: This medicine is only for you. Do not share this medicine with others. What if I miss a dose? Keep appointments for follow-up doses. It is important not to miss your dose. Call your care team if you are unable to keep an appointment. What may interact with this medication? Do not take this medication with any of the following: Deferoxamine Dimercaprol Other iron products This medication may also interact with the following: Chloramphenicol Deferasirox This list may not describe all possible interactions. Give your health care provider a list of all the medicines, herbs, non-prescription drugs, or dietary supplements you use. Also tell them if you smoke,  drink alcohol, or use illegal drugs. Some items may interact with your medicine. What should I watch for while using this medication? Visit your care team regularly. Tell your care team if your symptoms do not start to get better or if they get worse. You may need blood work done while you are taking this medication. You may need to follow a special diet. Talk to your care team. Foods that contain iron include: whole grains/cereals, dried fruits, beans, or peas, leafy green vegetables, and organ meats (liver, kidney). What side effects may I notice from receiving this medication? Side effects that you should report to your care team as soon as possible: Allergic reactions--skin rash, itching, hives, swelling of the face, lips, tongue, or throat Low blood pressure--dizziness, feeling faint or lightheaded, blurry vision Shortness of breath Side effects that usually do not require medical attention (report to your care team if they continue or are bothersome): Flushing Headache Joint pain Muscle pain Nausea Pain, redness, or irritation at injection site This list may not describe all possible side effects. Call your doctor for medical advice about side effects. You may report side effects to FDA at 1-800-FDA-1088. Where should I keep my medication? This medication is given in a hospital or clinic. It will not be stored at home. NOTE: This sheet is a summary. It may not cover all possible information. If you have questions about this medicine, talk to your doctor, pharmacist, or health care provider.  2024 Elsevier/Gold Standard (2022-11-15 00:00:00)

## 2023-07-23 NOTE — Progress Notes (Signed)
Pt declined 30 min post iron observation. VSS at discharge

## 2023-08-04 ENCOUNTER — Encounter: Payer: Self-pay | Admitting: Hematology and Oncology

## 2023-08-04 NOTE — Telephone Encounter (Signed)
 Joan [name]

## 2023-08-20 ENCOUNTER — Encounter: Payer: Self-pay | Admitting: Internal Medicine

## 2023-08-20 ENCOUNTER — Ambulatory Visit: Payer: Self-pay | Admitting: Internal Medicine

## 2023-08-20 VITALS — BP 104/82 | HR 97 | Resp 16 | Ht 61.25 in | Wt 142.0 lb

## 2023-08-20 DIAGNOSIS — Z79899 Other long term (current) drug therapy: Secondary | ICD-10-CM

## 2023-08-20 DIAGNOSIS — F32A Depression, unspecified: Secondary | ICD-10-CM

## 2023-08-20 DIAGNOSIS — G5622 Lesion of ulnar nerve, left upper limb: Secondary | ICD-10-CM

## 2023-08-20 DIAGNOSIS — Z23 Encounter for immunization: Secondary | ICD-10-CM

## 2023-08-20 DIAGNOSIS — S42402S Unspecified fracture of lower end of left humerus, sequela: Secondary | ICD-10-CM

## 2023-08-20 DIAGNOSIS — R2241 Localized swelling, mass and lump, right lower limb: Secondary | ICD-10-CM

## 2023-08-20 DIAGNOSIS — I10 Essential (primary) hypertension: Secondary | ICD-10-CM

## 2023-08-20 DIAGNOSIS — D509 Iron deficiency anemia, unspecified: Secondary | ICD-10-CM

## 2023-08-20 DIAGNOSIS — K219 Gastro-esophageal reflux disease without esophagitis: Secondary | ICD-10-CM

## 2023-08-20 DIAGNOSIS — Z1159 Encounter for screening for other viral diseases: Secondary | ICD-10-CM

## 2023-08-20 DIAGNOSIS — B351 Tinea unguium: Secondary | ICD-10-CM

## 2023-08-20 DIAGNOSIS — Z1231 Encounter for screening mammogram for malignant neoplasm of breast: Secondary | ICD-10-CM

## 2023-08-20 MED ORDER — LISINOPRIL 2.5 MG PO TABS: ORAL_TABLET | ORAL | 3 refills | Status: AC

## 2023-08-20 MED ORDER — OMEPRAZOLE 20 MG PO CPDR: 20.0000 mg | DELAYED_RELEASE_CAPSULE | Freq: Every day | ORAL | 3 refills | Status: AC

## 2023-08-20 MED ORDER — TERBINAFINE HCL 250 MG PO TABS: ORAL_TABLET | ORAL | 0 refills | Status: DC

## 2023-08-20 MED ORDER — SERTRALINE HCL 100 MG PO TABS: 100.0000 mg | ORAL_TABLET | Freq: Every day | ORAL | 3 refills | Status: AC

## 2023-08-20 MED ORDER — AMLODIPINE BESYLATE 10 MG PO TABS: 10.0000 mg | ORAL_TABLET | Freq: Every day | ORAL | 3 refills | Status: AC

## 2023-08-20 MED ORDER — ASPIRIN 81 MG PO TBEC: 81.0000 mg | DELAYED_RELEASE_TABLET | Freq: Every day | ORAL | 3 refills | Status: AC

## 2023-08-20 NOTE — Progress Notes (Signed)
 Subjective:    Patient ID: Joan [name], female   DOB: [birth year], 58 y.o.   MRN: [id number]   HPI  Here with her sister   Left arm injury when fell on deck and landed on elbow 06/2019:  she fractured her humeral trochlea and capitellum.  She did not follow up with ortho and now with decreased ROM of elbow  She is also having numbness and tingling involving ulnar distribution of left hand and also goes into upper arm.  The latter has been a problem for past 2 months.  She is not aware of leaning on the elbow or reinjury.  2.  Right foot swelling:  started a few weeks ago.  Cannot recall an injury.  May have twisted her ankle just before.  Swelling occurs at base of toes and throbs.    3.  Toenail onychomycosis:  Never obtained Terbinafine last year as couldn't afford.    4.  Hypertension:  tolerating lisinopril and amlodipine fine.    5.  Iron Deficiency Anemia:  Received iron infusions with Dr Pamelia Hoit in January.  Has recheck of CBC in April with him.  She has not had melena or hematochezia since a year ago.  She last had EGD/colonoscopy and capsule endoscopy in 2022 with Dr. Tomasa Rand.  FIT negative in 2022.  Current Meds  Medication Sig   amLODipine (NORVASC) 10 MG tablet Take 1 tablet (10 mg total) by mouth daily.   lisinopril (ZESTRIL) 5 MG tablet 1/2 tab by mouth daily   omeprazole (PRILOSEC) 20 MG capsule Take 20 mg by mouth daily.   sertraline (ZOLOFT) 100 MG tablet Take 1 tablet (100 mg total) by mouth daily.   No Known Allergies   Review of Systems    Objective:   BP 104/82 (BP Location: Right Arm, Patient Position: Sitting, Cuff Size: Normal)   Pulse 97   Resp 16   Ht 5' 1.25" (1.556 m)   Wt 142 lb (64.4 kg)   SpO2 97%   BMI 26.61 kg/m   Physical Exam HENT:     Head: Normocephalic and atraumatic.     Right Ear: Tympanic membrane, ear canal and external ear normal.     Left Ear: Tympanic membrane, ear canal and external ear normal.     Nose: Nose  normal.     Mouth/Throat:     Mouth: Mucous membranes are moist.     Pharynx: Oropharynx is clear.  Eyes:     Extraocular Movements: Extraocular movements intact.     Conjunctiva/sclera: Conjunctivae normal.     Pupils: Pupils are equal, round, and reactive to light.  Cardiovascular:     Rate and Rhythm: Normal rate and regular rhythm.     Heart sounds: No murmur heard.    No friction rub.  Pulmonary:     Effort: Pulmonary effort is normal.     Breath sounds: Normal breath sounds.  Abdominal:     General: Bowel sounds are normal.     Palpations: Abdomen is soft. There is no mass.     Tenderness: There is no abdominal tenderness.     Hernia: No hernia is present.  Musculoskeletal:     Left elbow: Decreased range of motion (Cannot extend beyond about 120 degrees.).     Cervical back: Normal range of motion and neck supple.       Feet:  Feet:     Comments: Left 4th and 5th toenail thickening and discoloration Right foot swelling below  toes 2nd through 4th  Lymphadenopathy:     Head:     Right side of head: No submental or submandibular adenopathy.     Left side of head: No submental or submandibular adenopathy.     Cervical: No cervical adenopathy.     Upper Body:     Right upper body: No supraclavicular adenopathy.     Left upper body: No supraclavicular adenopathy.  Neurological:     Mental Status: She is alert.     Comments: + tinels over medial epicondylar area.       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  Elbow pad and and avoid leaning on the area  2.  Decreased ROM of left elbow.  Likely limited options as has already healed.  Referral to ortho.   3.  Hypertension:  controlled.  4.  Toenail onychomycosis:  Terbinafine 250 mg once daily for 84 days.  Spray shoes with Lysol and clean shower floors.  5.  Swelling of right foot:  xray to evaluate.  6.  Anemia:  as per hematology.  Receiving iron infusions.    7.  HM:   mammogram.,  Spikevax and influenza vaccines, COVID, CBC, CMP, FLP, Hep C screen.  8  Anxiety and depression:  refilled Sertraline.

## 2023-08-21 LAB — COMPREHENSIVE METABOLIC PANEL
ALT: 14 IU/L (ref 0–32)
AST: 18 IU/L (ref 0–40)
Albumin: 4.3 g/dL (ref 3.8–4.9)
Alkaline Phosphatase: 125 IU/L — ABNORMAL HIGH (ref 44–121)
BUN/Creatinine Ratio: 10 (ref 9–23)
BUN: 10 mg/dL (ref 6–24)
Bilirubin Total: 0.2 mg/dL (ref 0.0–1.2)
CO2: 22 mmol/L (ref 20–29)
Calcium: 9.3 mg/dL (ref 8.7–10.2)
Chloride: 103 mmol/L (ref 96–106)
Creatinine, Ser: 1.01 mg/dL — ABNORMAL HIGH (ref 0.57–1.00)
Globulin, Total: 2.9 g/dL (ref 1.5–4.5)
Glucose: 94 mg/dL (ref 70–99)
Potassium: 4.2 mmol/L (ref 3.5–5.2)
Sodium: 142 mmol/L (ref 134–144)
Total Protein: 7.2 g/dL (ref 6.0–8.5)
eGFR: 65 mL/min/{1.73_m2} (ref >59)

## 2023-08-21 LAB — CBC WITH DIFFERENTIAL/PLATELET
Basophils Absolute: 0.1 10*3/uL (ref 0.0–0.2)
Basos: 1 %
EOS (ABSOLUTE): 0.2 10*3/uL (ref 0.0–0.4)
Eos: 3 %
Hematocrit: 37.6 % (ref 34.0–46.6)
Hemoglobin: 11.5 g/dL (ref 11.1–15.9)
Immature Grans (Abs): 0 10*3/uL (ref 0.0–0.1)
Immature Granulocytes: 0 %
Lymphocytes Absolute: 1.7 10*3/uL (ref 0.7–3.1)
Lymphs: 28 %
MCH: 25.8 pg — ABNORMAL LOW (ref 26.6–33.0)
MCHC: 30.6 g/dL — ABNORMAL LOW (ref 31.5–35.7)
MCV: 84 fL (ref 79–97)
Monocytes Absolute: 0.6 10*3/uL (ref 0.1–0.9)
Monocytes: 10 %
Neutrophils Absolute: 3.4 10*3/uL (ref 1.4–7.0)
Neutrophils: 58 %
Platelets: 370 10*3/uL (ref 150–450)
RBC: 4.46 x10E6/uL (ref 3.77–5.28)
RDW: 19.8 % — ABNORMAL HIGH (ref 11.7–15.4)
WBC: 5.9 10*3/uL (ref 3.4–10.8)

## 2023-08-21 LAB — LIPID PANEL W/O CHOL/HDL RATIO
Cholesterol, Total: 261 mg/dL — ABNORMAL HIGH (ref 100–199)
HDL: 76 mg/dL (ref >39)
LDL Chol Calc (NIH): 145 mg/dL — ABNORMAL HIGH (ref 0–99)
Triglycerides: 225 mg/dL — ABNORMAL HIGH (ref 0–149)
VLDL Cholesterol Cal: 40 mg/dL (ref 5–40)

## 2023-08-21 LAB — HEPATITIS C ANTIBODY: Hep C Virus Ab: NONREACTIVE

## 2023-09-04 ENCOUNTER — Telehealth: Payer: Self-pay

## 2023-09-04 NOTE — Telephone Encounter (Signed)
 [facility] does not have Terbinafine available. Patient would like to know alternative to get medication.

## 2023-09-08 ENCOUNTER — Telehealth: Payer: Self-pay

## 2023-09-08 NOTE — Telephone Encounter (Signed)
 Telephoned patient at mobile number. Left a voice message with [facility] (scholarship) contact information.

## 2023-09-09 NOTE — Telephone Encounter (Signed)
 She can get it with GoodRx coupon--should be printed up on printer in nursing office Lowest cost at [employer] out which one she wants to go to and then send there Terbinafine 250 mg daily for 84 days.   Usual follow up

## 2023-09-09 NOTE — Telephone Encounter (Signed)
 Left a voicemail asking patient to return our call.

## 2023-09-09 NOTE — Telephone Encounter (Signed)
 Patient cannot afford medication for $30 even with coupon. Patient will still come by to get coupon in case things change.

## 2023-09-18 ENCOUNTER — Encounter: Payer: Self-pay | Admitting: Hematology and Oncology

## 2023-09-18 ENCOUNTER — Ambulatory Visit
Admission: RE | Admit: 2023-09-18 | Discharge: 2023-09-18 | Disposition: A | Discharge status: Home / Self Care | Source: Ambulatory Visit | Attending: Internal Medicine | Admitting: Internal Medicine

## 2023-09-18 DIAGNOSIS — Z1231 Encounter for screening mammogram for malignant neoplasm of breast: Secondary | ICD-10-CM

## 2023-09-23 ENCOUNTER — Other Ambulatory Visit: Payer: Self-pay | Admitting: Internal Medicine

## 2023-09-23 DIAGNOSIS — R928 Other abnormal and inconclusive findings on diagnostic imaging of breast: Secondary | ICD-10-CM

## 2023-09-29 ENCOUNTER — Other Ambulatory Visit: Payer: Self-pay

## 2023-09-29 DIAGNOSIS — D509 Iron deficiency anemia, unspecified: Secondary | ICD-10-CM

## 2023-09-30 ENCOUNTER — Encounter

## 2023-09-30 ENCOUNTER — Inpatient Hospital Stay: Payer: No Typology Code available for payment source | Attending: Hematology and Oncology

## 2023-09-30 DIAGNOSIS — D509 Iron deficiency anemia, unspecified: Secondary | ICD-10-CM | POA: Insufficient documentation

## 2023-10-01 ENCOUNTER — Telehealth: Payer: Self-pay | Admitting: Internal Medicine

## 2023-10-01 NOTE — Telephone Encounter (Signed)
 Patient called to reschedule appointments due to family emergency.

## 2023-10-02 ENCOUNTER — Other Ambulatory Visit: Payer: Self-pay

## 2023-10-02 ENCOUNTER — Inpatient Hospital Stay: Payer: No Typology Code available for payment source | Admitting: Hematology and Oncology

## 2023-10-09 ENCOUNTER — Ambulatory Visit

## 2023-10-16 ENCOUNTER — Inpatient Hospital Stay

## 2023-10-20 ENCOUNTER — Telehealth: Payer: Self-pay

## 2023-10-20 ENCOUNTER — Inpatient Hospital Stay: Admitting: Hematology and Oncology

## 2023-10-20 NOTE — Telephone Encounter (Signed)
 Attempted to call pt as we need to r/s her phone visit with MD. She was a no show for labs 4/24. We need labs completed at least 2 days prior to MD visit. LVM for call back.

## 2023-10-20 NOTE — Assessment & Plan Note (Deleted)
 IDA due to GI Bleed: EGD: unremarkable, Capsule endoscopy Neg,   Hospitalization: 04/17/21-04/19/21 Biopsies for H.Pylori: Neg, No celiac changes   IV iron : November 2022, October 2023, September 2024, January 2025   Lab review: 04/17/2021: Hemoglobin 5.7 (hospitalization)  06/05/2021: Hemoglobin 14.4 09/03/2021: Hemoglobin 14.7 03/04/2022: Hemoglobin 8.9, MCV 83, creatinine 0.87 04/08/2022: Hemoglobin 8.8, MCV 70.7 10/21/22: Hemoglobin 10.1, MCV 81.2, iron  studies: Iron  saturation 12%, TIBC 515, ferritin 3 05/29/2023: Hemoglobin 11.6, MCV 80.7, TIBC 507, ferritin 6 08/20/2023: Hemoglobin 11.5, MCV 84,    Recheck labs in 3 months and telephone visit 2 days later to discuss results

## 2023-10-21 ENCOUNTER — Inpatient Hospital Stay

## 2023-10-21 DIAGNOSIS — D509 Iron deficiency anemia, unspecified: Secondary | ICD-10-CM

## 2023-10-21 LAB — CBC WITH DIFFERENTIAL (CANCER CENTER ONLY)
Abs Immature Granulocytes: 0.01 10*3/uL (ref 0.00–0.07)
Basophils Absolute: 0.1 10*3/uL (ref 0.0–0.1)
Basophils Relative: 1 %
Eosinophils Absolute: 0.1 10*3/uL (ref 0.0–0.5)
Eosinophils Relative: 3 %
HCT: 29.3 % — ABNORMAL LOW (ref 36.0–46.0)
Hemoglobin: 8.6 g/dL — ABNORMAL LOW (ref 12.0–15.0)
Immature Granulocytes: 0 %
Lymphocytes Relative: 27 %
Lymphs Abs: 1.4 10*3/uL (ref 0.7–4.0)
MCH: 22.3 pg — ABNORMAL LOW (ref 26.0–34.0)
MCHC: 29.4 g/dL — ABNORMAL LOW (ref 30.0–36.0)
MCV: 75.9 fL — ABNORMAL LOW (ref 80.0–100.0)
Monocytes Absolute: 0.5 10*3/uL (ref 0.1–1.0)
Monocytes Relative: 10 %
Neutro Abs: 2.9 10*3/uL (ref 1.7–7.7)
Neutrophils Relative %: 59 %
Platelet Count: 489 10*3/uL — ABNORMAL HIGH (ref 150–400)
RBC: 3.86 MIL/uL — ABNORMAL LOW (ref 3.87–5.11)
RDW: 16.8 % — ABNORMAL HIGH (ref 11.5–15.5)
WBC Count: 5 10*3/uL (ref 4.0–10.5)
nRBC: 0 % (ref 0.0–0.2)

## 2023-10-21 LAB — CMP (CANCER CENTER ONLY)
ALT: 12 U/L (ref 0–44)
AST: 16 U/L (ref 15–41)
Albumin: 4.4 g/dL (ref 3.5–5.0)
Alkaline Phosphatase: 116 U/L (ref 38–126)
Anion gap: 8 (ref 5–15)
BUN: 6 mg/dL (ref 6–20)
CO2: 26 mmol/L (ref 22–32)
Calcium: 9.2 mg/dL (ref 8.9–10.3)
Chloride: 104 mmol/L (ref 98–111)
Creatinine: 0.89 mg/dL (ref 0.44–1.00)
GFR, Estimated: >60 mL/min
Glucose, Bld: 93 mg/dL (ref 70–99)
Potassium: 3.7 mmol/L (ref 3.5–5.1)
Sodium: 138 mmol/L (ref 135–145)
Total Bilirubin: 0.6 mg/dL (ref 0.0–1.2)
Total Protein: 7.6 g/dL (ref 6.5–8.1)

## 2023-10-21 LAB — IRON AND IRON BINDING CAPACITY (CC-WL,HP ONLY)
Iron: 18 ug/dL — ABNORMAL LOW (ref 28–170)
Saturation Ratios: 3 % — ABNORMAL LOW (ref 10.4–31.8)
TIBC: 606 ug/dL — ABNORMAL HIGH (ref 250–450)
UIBC: 588 ug/dL — ABNORMAL HIGH (ref 148–442)

## 2023-10-21 LAB — SAMPLE TO BLOOD BANK

## 2023-10-22 LAB — FERRITIN: Ferritin: 4 ng/mL — ABNORMAL LOW (ref 11–307)

## 2023-10-27 ENCOUNTER — Inpatient Hospital Stay: Attending: Hematology and Oncology | Admitting: Hematology and Oncology

## 2023-10-27 DIAGNOSIS — D509 Iron deficiency anemia, unspecified: Secondary | ICD-10-CM

## 2023-10-27 NOTE — Assessment & Plan Note (Signed)
 IDA due to GI Bleed: EGD: unremarkable, Capsule endoscopy Neg,   Hospitalization: 04/17/21-04/19/21 Biopsies for H.Pylori: Neg, No celiac changes   IV iron : November 2022, October 2023, September 2024, January 2025 (2 doses)   Lab review: 04/17/2021: Hemoglobin 5.7 (hospitalization)  06/05/2021: Hemoglobin 14.4 09/03/2021: Hemoglobin 14.7 03/04/2022: Hemoglobin 8.9, MCV 83, creatinine 0.87 04/08/2022: Hemoglobin 8.8, MCV 70.7 10/21/22: Hemoglobin 10.1, MCV 81.2, iron  studies: Iron  saturation 12%, TIBC 515, ferritin 3 05/29/2023: Hemoglobin 11.6, MCV 80.7, TIBC 507, ferritin 6 10/21/2023: Hemoglobin 8.6, MCV 75.9, platelets 489, iron  saturation 3%, TIBC 606, ferritin 4   She currently has cravings for ice chips. Patient will need 3 doses of IV iron .   Recheck labs in 3 months and telephone visit 2 days later to discuss results

## 2023-10-27 NOTE — Progress Notes (Signed)
 HEMATOLOGY-ONCOLOGY TELEPHONE VISIT PROGRESS NOTE  I connected with our patient on 10/27/23 at 10:15 AM EDT by telephone and verified that I am speaking with the correct person using two identifiers.  I discussed the limitations, risks, security and privacy concerns of performing an evaluation and management service by telephone and the availability of in person appointments.  I also discussed with the patient that there may be a patient responsible charge related to this service. The patient expressed understanding and agreed to proceed.   History of Present Illness: Follow-up of iron  deficiency anemia  History of Present Illness Joan [name] is a 58 year old female with chronic anemia who presents with fatigue and shortness of breath.  Her hemoglobin level is currently 8.6 g/dL, with a history of levels in the 8 g/dL range. She experiences fatigue and shortness of breath, especially when walking short distances. She craves ice chips, a recurring symptom. She received iron  infusions in January, approximately five months ago, and may require more frequent infusions, potentially every three months. She missed a previous infusion due to transportation issues, as she does not drive and is managing care for her father, who also does not drive. She lacks health insurance and relies on an orange card from [employer] for medical services.       REVIEW OF SYSTEMS:   Constitutional: Denies fevers, chills or abnormal weight loss All other systems were reviewed with the patient and are negative. Observations/Objective:     Assessment Plan:  Iron  deficiency anemia IDA due to GI Bleed: EGD: unremarkable, Capsule endoscopy Neg,   Hospitalization: 04/17/21-04/19/21 Biopsies for H.Pylori: Neg, No celiac changes   IV iron : November 2022, October 2023, September 2024, January 2025 (2 doses)   Lab review: 04/17/2021: Hemoglobin 5.7 (hospitalization)  06/05/2021: Hemoglobin 14.4 09/03/2021: Hemoglobin  14.7 03/04/2022: Hemoglobin 8.9, MCV 83, creatinine 0.87 04/08/2022: Hemoglobin 8.8, MCV 70.7 10/21/22: Hemoglobin 10.1, MCV 81.2, iron  studies: Iron  saturation 12%, TIBC 515, ferritin 3 05/29/2023: Hemoglobin 11.6, MCV 80.7, TIBC 507, ferritin 6 10/21/2023: Hemoglobin 8.6, MCV 75.9, platelets 489, iron  saturation 3%, TIBC 606, ferritin 4   She currently has cravings for ice chips and severe fatigue to minimal exertion. I recommended that the patient see gastroenterology.  She tells me that she has an appointment coming up with her PCP and she will discuss getting a referral.  Because of her difficulty getting to the appointments I would like to request for Monoferric. She has a lot of responsibilities including taking care of her father. Labs in 3 months and telephone visit 2 days later   I discussed the assessment and treatment plan with the patient. The patient was provided an opportunity to ask questions and all were answered. The patient agreed with the plan and demonstrated an understanding of the instructions. The patient was advised to call back or seek an in-person evaluation if the symptoms worsen or if the condition fails to improve as anticipated.   I provided 20 minutes of non-face-to-face time during this encounter.  This includes time for charting and coordination of care   Margert Sheerer, MD

## 2023-10-28 ENCOUNTER — Telehealth: Payer: Self-pay | Admitting: Hematology and Oncology

## 2023-10-28 NOTE — Telephone Encounter (Signed)
 Left vm about scheduled appt date and time.

## 2023-10-31 ENCOUNTER — Telehealth: Payer: Self-pay

## 2023-10-31 NOTE — Telephone Encounter (Signed)
 Monoferric will be delivered on 11/04/23. [place] and Rice Chamorro, please scheduled patient after that date.  Patient is receiving Advance Medication - Supplied Externally. Medication: MonoFerric Manufacture: [employer] Approval Dates: Approved from 10/29/2023 until 10/28/2024. ID: [id number] Reason: Self Pay

## 2023-11-10 ENCOUNTER — Ambulatory Visit (INDEPENDENT_AMBULATORY_CARE_PROVIDER_SITE_OTHER): Payer: Self-pay

## 2023-11-10 VITALS — BP 109/69 | HR 84 | Temp 98.1°F | Resp 18 | Ht 64.0 in | Wt 138.4 lb

## 2023-11-10 DIAGNOSIS — D509 Iron deficiency anemia, unspecified: Secondary | ICD-10-CM

## 2023-11-10 MED ORDER — SODIUM CHLORIDE 0.9 % IV SOLN
1000.0000 mg | Freq: Once | INTRAVENOUS | Status: AC
  Administered 2023-11-10: 1000 mg via INTRAVENOUS
  Filled 2023-11-10: qty 10

## 2023-11-10 NOTE — Progress Notes (Signed)
 Diagnosis: Iron  Deficiency Anemia  Provider:  Praveen Mannam MD  Procedure: IV Infusion  IV Type: Peripheral, IV Location: L Forearm  Monoferric  (Ferric Derisomaltose ), Dose: 1000 mg  Infusion Start Time: 1431  Infusion Stop Time: 1454  Post Infusion IV Care: Observation period completed and Peripheral IV Discontinued  Discharge: Condition: Good, Destination: Home . AVS Provided  Performed by:  Mckinze Poirier, RN

## 2023-11-13 ENCOUNTER — Other Ambulatory Visit: Payer: Self-pay

## 2023-11-13 ENCOUNTER — Telehealth: Payer: Self-pay

## 2023-11-13 DIAGNOSIS — Z79899 Other long term (current) drug therapy: Secondary | ICD-10-CM

## 2023-11-13 DIAGNOSIS — Z1159 Encounter for screening for other viral diseases: Secondary | ICD-10-CM

## 2023-11-13 NOTE — Telephone Encounter (Signed)
 Reports would like to be put on wait list due to upper right rib pain due to a falling off bed while sleeping Denies falling on an object Reports hard to breathe. Pain is radiating to back Denies vomiting but nausea due to pain No blood in stool   Patient would like to be on wait list.

## 2023-11-14 LAB — HEPATIC FUNCTION PANEL
ALT: 10 IU/L (ref 0–32)
AST: 16 IU/L (ref 0–40)
Albumin: 4 g/dL (ref 3.8–4.9)
Alkaline Phosphatase: 146 IU/L — ABNORMAL HIGH (ref 44–121)
Bilirubin Total: 0.4 mg/dL (ref 0.0–1.2)
Bilirubin, Direct: 0.15 mg/dL (ref 0.00–0.40)
Total Protein: 6.6 g/dL (ref 6.0–8.5)

## 2023-11-18 NOTE — Telephone Encounter (Signed)
 Patient has been scheduled

## 2023-11-19 ENCOUNTER — Encounter: Payer: Self-pay | Admitting: Internal Medicine

## 2023-11-19 ENCOUNTER — Ambulatory Visit: Payer: Self-pay | Admitting: Internal Medicine

## 2023-11-19 VITALS — BP 130/82 | HR 86 | Resp 16 | Ht 61.25 in | Wt 138.2 lb

## 2023-11-19 DIAGNOSIS — R0789 Other chest pain: Secondary | ICD-10-CM

## 2023-11-19 NOTE — Patient Instructions (Signed)
 Splint right chest with a pillow whenever you sneeze or cough. Also splint right chest with a pillow for 10 slow deep breaths every 1-2 hours Call for cough, shortness of breath, fever

## 2023-11-19 NOTE — Progress Notes (Signed)
    Subjective:    Patient ID: Joan [name], female   DOB: 1966/05/12, 58 y.o.   MRN: [id number]   HPI  Three days ago, rolled out of bed in her sleep, landing on right side on carpeted floor.  She had had pain in her right thorax with deep breathing, sneezing.  Hurts to lie on the right.  Hurts to get up and down due to the pain, though all of this is gradually improving.   No associated bruising.   No fever or cough.    Current Meds  Medication Sig   amLODipine  (NORVASC ) 10 MG tablet Take 1 tablet (10 mg total) by mouth daily.   aspirin  EC 81 MG tablet Take 1 tablet (81 mg total) by mouth daily. Swallow whole.   lisinopril  (ZESTRIL ) 2.5 MG tablet 1/2 tab by mouth daily   omeprazole  (PRILOSEC) 20 MG capsule Take 1 capsule (20 mg total) by mouth daily.   sertraline  (ZOLOFT ) 100 MG tablet Take 1 tablet (100 mg total) by mouth daily.   [DISCONTINUED] terbinafine  (LAMISIL ) 250 MG tablet 1 tab by mouth daily for 84 days   No Known Allergies   Review of Systems    Objective:   BP 130/82 (BP Location: Left Arm, Patient Position: Sitting, Cuff Size: Normal)   Pulse 86   Resp 16   Ht 5' 1.25" (1.556 m)   Wt 138 lb 4 oz (62.7 kg)   SpO2 97%   BMI 25.91 kg/m   Physical Exam Constitutional:      Comments: pale  HENT:     Head: Normocephalic and atraumatic.  Eyes:     Extraocular Movements: Extraocular movements intact.     Pupils: Pupils are equal, round, and reactive to light.     Comments: Conjunctivae pale  Cardiovascular:     Rate and Rhythm: Normal rate and regular rhythm.     Pulses: Normal pulses.     Heart sounds: No murmur heard.    No friction rub.  Pulmonary:     Effort: Pulmonary effort is normal.     Breath sounds: Normal breath sounds.  Chest:     Comments: No bruising. Mild to moderate tenderness over lateral mid thoracic right ribs to anterior area as well.   No contusion, drop offs, or crepitation.    Abdominal:     General: Bowel sounds are  normal.     Palpations: Abdomen is soft.     Tenderness: There is no abdominal tenderness.  Musculoskeletal:     Cervical back: Normal range of motion and neck supple.  Neurological:     Mental Status: She is alert.      Assessment & Plan   Right thoracic chest wall pain:  No obvious rib fracture.  Discussed splinting with a pillow if needs to sneeze or cough and to also to splint with deep breathing for 10 slow breaths every 1 to 2 hours. To call for signs or symptoms of respiratory infection.  2.  Iron  deficiency anemia:  receiving IV infusions of iron  with hematology.  No periods and extensive GI work up in 2022

## 2023-11-19 NOTE — Progress Notes (Deleted)
 Joan [name]

## 2023-12-03 ENCOUNTER — Telehealth: Payer: Self-pay | Admitting: Internal Medicine

## 2023-12-03 NOTE — Telephone Encounter (Signed)
 Contacted patient and notified her that we received a letter from the breast center of [facility] stating that they have attempted to contact patient but have not being able to get a hold of her.  -patient states she went to the mobile breast center and had the examination done and was sent to a different place for additional examination but patient states she was told they do not accept the orange card and she did not schedule.  I contacted the [facility] of [facility] and spoke with Jolie Neat, she notified me that they in fact do not accept orange card but patient can apply for the [facility] scholarship program and since patient has orange card active she should be able to qualify for scholarship.  I contacted patient and notified her of the scholarship information and provided patient with the number to call and set up a follow up mammogram through the program. Number given to patient [phone].  Patient agree and stated she will call to schedule appointment

## 2023-12-05 ENCOUNTER — Telehealth: Payer: Self-pay

## 2023-12-05 ENCOUNTER — Other Ambulatory Visit: Payer: Self-pay

## 2023-12-05 DIAGNOSIS — N6489 Other specified disorders of breast: Secondary | ICD-10-CM

## 2023-12-05 NOTE — Telephone Encounter (Signed)
 Telephoned patient at mobile number. Left a voice message with [facility] contact information.

## 2024-01-20 ENCOUNTER — Ambulatory Visit: Payer: Self-pay | Admitting: Internal Medicine

## 2024-01-20 ENCOUNTER — Encounter: Payer: Self-pay | Admitting: Internal Medicine

## 2024-01-20 VITALS — BP 132/76 | HR 76 | Resp 18 | Ht 62.0 in | Wt 138.0 lb

## 2024-01-20 DIAGNOSIS — I10 Essential (primary) hypertension: Secondary | ICD-10-CM

## 2024-01-20 DIAGNOSIS — Z0000 Encounter for general adult medical examination without abnormal findings: Secondary | ICD-10-CM

## 2024-01-20 DIAGNOSIS — F32A Depression, unspecified: Secondary | ICD-10-CM

## 2024-01-20 DIAGNOSIS — D509 Iron deficiency anemia, unspecified: Secondary | ICD-10-CM

## 2024-01-20 DIAGNOSIS — F419 Anxiety disorder, unspecified: Secondary | ICD-10-CM

## 2024-01-20 DIAGNOSIS — R87610 Atypical squamous cells of undetermined significance on cytologic smear of cervix (ASC-US): Secondary | ICD-10-CM

## 2024-01-20 DIAGNOSIS — K029 Dental caries, unspecified: Secondary | ICD-10-CM

## 2024-01-20 DIAGNOSIS — R87810 Cervical high risk human papillomavirus (HPV) DNA test positive: Secondary | ICD-10-CM

## 2024-01-20 DIAGNOSIS — R928 Other abnormal and inconclusive findings on diagnostic imaging of breast: Secondary | ICD-10-CM

## 2024-01-20 MED ORDER — TRAZODONE HCL 50 MG PO TABS: ORAL_TABLET | ORAL | 3 refills | Status: AC

## 2024-01-20 NOTE — Progress Notes (Signed)
 Subjective:    Patient ID: Joan [name], female   DOB: 1965-08-09, 58 y.o.   MRN: [id number]   HPI  CPE with pap  1.  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  Remained + for HPV and pap showed ASCUS.  She was referred back to gynecology, but apparently did not go.  Unable to see if attempt to contact made and patient has no memory of this.    2.  Mammogram:  Left breast asymmetry with mammogram in March and states has appt next month for those evaluations.    3.  Osteoprevention:  Drinks milk and though does not currently, she will drink 3-4 cups daily.  Not very physically active.  Walks to [employer] daily and takes trash once weekly.  Drinks a lot of [employer]eps[employer] daily.  4.  Guaiac Cards/FIT:  Negative for blood in 2022    5.  Colonoscopy:  03/2021 with Dr. Stacia.  No concerning findings, including polyps.  EGD as well with no significant concern.  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  6.  Immunizations:   Immunization History  Administered Date(s) Administered   Influenza Inj Mdck Quad Pf 06/27/2022   Influenza Split 04/28/2019   Influenza, Mdck, Trivalent,PF 6+ MOS(egg free) 08/20/2023   Influenza, Seasonal, Injecte, Preservative Fre 04/14/2013, 04/06/2014, 08/03/2018, 04/17/2021   Influenza-Unspecified 04/18/2021   Moderna Covid-19 Fall Seasonal Vaccine 72yrs & older 08/20/2023   Moderna Sars-Covid-2 Vaccination 10/27/2019   Tdap 02/28/2012, 05/06/2015   Zoster Recombinant(Shingrix ) 02/18/2022, 06/27/2022     7.  Glucose/Cholesterol:  Sugars fine in past, but cholesterol significantly higher in Feb.  2025.   Lipid Panel     Component Value Date/Time   CHOL 261 (H) 08/20/2023 1733   TRIG 225 (H) 08/20/2023 1733   HDL 76 08/20/2023 1733   LDLCALC 145 (H) 08/20/2023 1733   LABVLDL 40 08/20/2023 1733     Current Meds  Medication Sig   amLODipine  (NORVASC ) 10 MG tablet Take 1 tablet  (10 mg total) by mouth daily.   aspirin  EC 81 MG tablet Take 1 tablet (81 mg total) by mouth daily. Swallow whole.   lisinopril  (ZESTRIL ) 2.5 MG tablet 1/2 tab by mouth daily   omeprazole  (PRILOSEC) 20 MG capsule Take 1 capsule (20 mg total) by mouth daily.   sertraline  (ZOLOFT ) 100 MG tablet Take 1 tablet (100 mg total) by mouth daily.   No Known Allergies  Past Medical History:  Diagnosis Date   Abnormal Pap smear of cervix    not clear how long ago   Anxiety    Arm pain    Coronary artery disease    Dizziness    GERD (gastroesophageal reflux disease)    History of aortic dissection    HPV (human papilloma virus) infection    Hyperlipidemia    Hypertension    Insomnia    Joint pain, knee    Onychomycosis of toenail    Did not take Lamisil  as could not afford.   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   Past Surgical History:  Procedure Laterality Date   AORTA SURGERY  02/09/2009   BIOPSY  04/18/2021   Procedure: BIOPSY;  Surgeon: Stacia Glendia BRAVO, MD;  Location: [facility] ENDOSCOPY;  Service: Gastroenterology;;   COLONOSCOPY WITH PROPOFOL  N/A 04/18/2021   Procedure: COLONOSCOPY WITH PROPOFOL ;  Surgeon: Stacia Glendia BRAVO, MD;  Location: [facility]  ENDOSCOPY;  Service: Gastroenterology;  Laterality: N/A;   ESOPHAGOGASTRODUODENOSCOPY (EGD) WITH PROPOFOL  N/A 04/18/2021   Procedure: ESOPHAGOGASTRODUODENOSCOPY (EGD) WITH PROPOFOL ;  Surgeon: Stacia Glendia BRAVO, MD;  Location: [facility] ENDOSCOPY;  Service: Gastroenterology;  Laterality: N/A;   GIVENS CAPSULE STUDY N/A 04/18/2021   Procedure: GIVENS CAPSULE STUDY;  Surgeon: Stacia Glendia BRAVO, MD;  Location: [facility] ENDOSCOPY;  Service: Gastroenterology;  Laterality: N/A;   Family History  Problem Relation Age of Onset   Heart disease Mother        MI, first MI suffered at age 27   COPD Mother        heavy smoker   Hyperlipidemia Father    Hypertension Father    Cancer Father 69       pancreatic   Atrial fibrillation Father         resolved now 2023   Depression Sister    Anxiety disorder Sister        Panic disorder   Hyperlipidemia Sister    Heart disease Sister 75       2v CABG   Hypertension Sister    Fibrocystic breast disease Sister    Restless legs syndrome Sister    AAA (abdominal aortic aneurysm) Sister    Anxiety disorder Sister    COPD Sister        smoker   Varicose Veins Brother    Anxiety disorder Brother    ADD / ADHD Brother    Anxiety disorder Daughter        and depression   Breast cancer Neg Hx    Social History   Socioeconomic History   Marital status: Divorced    Spouse name: Not on file   Number of children: 1   Years of education: 9   Highest education level: High school graduate  Occupational History   Occupation: unemployed    Comment: 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  Tobacco Use   Smoking status: Never    Passive exposure: Current   Smokeless tobacco: Never  Vaping Use   Vaping status: Never Used  Substance and Sexual Activity   Alcohol use: Yes    Comment: Occasional 1-2 beers.   Drug use: Not Currently    Types: Cocaine    Comment: Before 2014   Sexual activity: Not Currently    Birth control/protection: Post-menopausal    Comment: Years not active.  Other Topics Concern   Not on file  Social History Narrative   Lives with her father currently, who is also a stressor for her.   Older sister, Olam, also involved with his care.   Has a good relationship with her daughter.   Does not drive due to a DWI  in 7982.     Does not have the money to get a breathalizer in car and take care of a car.   Social Drivers of Health   Financial Resource Strain: Medium Risk (02/18/2022)   Overall Financial Resource Strain (CARDIA)    Difficulty of Paying Living Expenses: Somewhat hard  Food Insecurity: No Food Insecurity (01/20/2024)   Hunger Vital Sign    Worried About Running Out of Food in the Last Year: Never true    Ran Out of Food in the  Last Year: Never true  Transportation Needs: Unmet Transportation Needs (10/15/2023)   PRAPARE - Administrator, Civil Service (Medical): Yes    Lack of Transportation (Non-Medical): No  Physical Activity: Not on File (10/11/2021)  Received from [facility]   Physical Activity    Physical Activity: 0  Stress: Not on File (10/11/2021)   Received from [facility]   Stress    Stress: 0  Social Connections: Not on File (03/15/2023)   Received from [facility]   Social Connections    Connectedness: 0  Intimate Partner Violence: Not At Risk (02/18/2022)   Humiliation, Afraid, Rape, and Kick questionnaire    Fear of Current or Ex-Partner: No    Emotionally Abused: No    Physically Abused: No    Sexually Abused: No      Review of Systems  Respiratory:  Positive for shortness of breath (Dyspnea and lack of energy when iron  low.).   Cardiovascular:  Negative for chest pain and leg swelling.  Gastrointestinal:  Negative for abdominal pain and blood in stool (No melena).  Psychiatric/Behavioral:  Positive for dysphoric mood (Taking care of father who is in hospice). The patient is nervous/anxious.       Objective:   BP 132/76 (BP Location: Left Arm, Patient Position: Sitting, Cuff Size: Normal)   Pulse 76   Resp 18   Ht 5' 2 (1.575 m)   Wt 138 lb (62.6 kg)   LMP 11/05/2013 Comment: neg preg test  BMI 25.24 kg/m   Physical Exam Constitutional:      Comments: Chronically ill appearing.  HENT:     Head: Normocephalic and atraumatic.     Right Ear: Tympanic membrane, ear canal and external ear normal.     Left Ear: Tympanic membrane, ear canal and external ear normal.     Nose: Nose normal.     Mouth/Throat:     Mouth: Mucous membranes are moist.     Pharynx: Oropharynx is clear.     Comments: Dental decay Eyes:     Extraocular Movements: Extraocular movements intact.     Conjunctiva/sclera: Conjunctivae normal.     Pupils: Pupils are equal, round, and reactive to light.  Neck:      Thyroid: No thyroid mass or thyromegaly.  Cardiovascular:     Rate and Rhythm: Normal rate and regular rhythm.     Heart sounds: S1 normal and S2 normal. No murmur heard.    No friction rub. No S3 or S4 sounds.     Comments: No carotid bruits.  Carotid, radial, femoral, DP and PT pulses normal and equal.   Pulmonary:     Effort: Pulmonary effort is normal.     Breath sounds: Normal breath sounds and air entry.  Chest:  Breasts:    Right: No inverted nipple, mass or nipple discharge.     Left: No inverted nipple, mass or nipple discharge.  Abdominal:     General: Bowel sounds are normal.     Palpations: Abdomen is soft. There is no hepatomegaly, splenomegaly or mass.     Tenderness: There is no abdominal tenderness.     Hernia: No hernia is present.  Genitourinary:    Comments: Normal external female genitalia Unable to palpate cervix No uterine or adnexal mass or tenderness.  Musculoskeletal:        General: Normal range of motion.     Cervical back: Normal range of motion and neck supple.     Right lower leg: No edema.     Left lower leg: No edema.  Lymphadenopathy:     Head:     Right side of head: No submental or submandibular adenopathy.     Left side of head: No submental or  submandibular adenopathy.     Cervical: No cervical adenopathy.     Upper Body:     Right upper body: No supraclavicular or axillary adenopathy.     Left upper body: No supraclavicular or axillary adenopathy.     Lower Body: No right inguinal adenopathy. No left inguinal adenopathy.  Skin:    General: Skin is warm.     Capillary Refill: Capillary refill takes less than 2 seconds.     Coloration: Skin is pale (Coppery skin color in sun exposed skin.  States this is the color she gets with being out in sun.).  Neurological:     General: No focal deficit present.     Mental Status: She is alert and oriented to person, place, and time.     Cranial Nerves: Cranial nerves 2-12 are intact.      Sensory: Sensation is intact.     Motor: Motor function is intact.     Coordination: Coordination is intact.     Gait: Gait is intact.     Deep Tendon Reflexes: Reflexes are normal and symmetric.  Psychiatric:        Mood and Affect: Mood is anxious.      Assessment & Plan   CPE without pap Encouraged influenza and COVID vaccination in Sept/Oct FIT to return in 2 weeks Fasting labs in 2 weeks:  FLP, CBC, CMP, TSH, first morning cortisol.    2.  Abnormal Mammogram:  has follow up views next month.  3.  Coppery skin:  check morning cortisol.  Likely just her color of skin with sun exposure based on history  4.  NILM in past with HPV and now ASCUS with + HPV:  referral again to [facility].  Encouraged patient to make sure she is listening to voicemails/answering phone.    5.  Insomnia: Trazodone  25 to 50 mg at bedtime.    6.  Anxiety and depression :  Sertraline  100 mg daily  7.  Hypertension:  controlled.    8.  Dental decay:  dental referral.

## 2024-01-28 ENCOUNTER — Other Ambulatory Visit: Payer: Self-pay | Admitting: *Deleted

## 2024-01-28 ENCOUNTER — Inpatient Hospital Stay: Attending: Hematology and Oncology

## 2024-01-28 ENCOUNTER — Inpatient Hospital Stay

## 2024-01-28 DIAGNOSIS — D509 Iron deficiency anemia, unspecified: Secondary | ICD-10-CM

## 2024-01-28 DIAGNOSIS — Z79899 Other long term (current) drug therapy: Secondary | ICD-10-CM | POA: Insufficient documentation

## 2024-01-28 DIAGNOSIS — R5383 Other fatigue: Secondary | ICD-10-CM | POA: Insufficient documentation

## 2024-01-28 DIAGNOSIS — F5089 Other specified eating disorder: Secondary | ICD-10-CM | POA: Insufficient documentation

## 2024-01-28 LAB — CBC WITH DIFFERENTIAL (CANCER CENTER ONLY)
Abs Immature Granulocytes: 0 K/uL (ref 0.00–0.07)
Basophils Absolute: 0 K/uL (ref 0.0–0.1)
Basophils Relative: 1 %
Eosinophils Absolute: 0.2 K/uL (ref 0.0–0.5)
Eosinophils Relative: 5 %
HCT: 31.1 % — ABNORMAL LOW (ref 36.0–46.0)
Hemoglobin: 9.5 g/dL — ABNORMAL LOW (ref 12.0–15.0)
Immature Granulocytes: 0 %
Lymphocytes Relative: 28 %
Lymphs Abs: 1.1 K/uL (ref 0.7–4.0)
MCH: 23.1 pg — ABNORMAL LOW (ref 26.0–34.0)
MCHC: 30.5 g/dL (ref 30.0–36.0)
MCV: 75.7 fL — ABNORMAL LOW (ref 80.0–100.0)
Monocytes Absolute: 0.5 K/uL (ref 0.1–1.0)
Monocytes Relative: 12 %
Neutro Abs: 2 K/uL (ref 1.7–7.7)
Neutrophils Relative %: 54 %
Platelet Count: 389 K/uL (ref 150–400)
RBC: 4.11 MIL/uL (ref 3.87–5.11)
RDW: 20.2 % — ABNORMAL HIGH (ref 11.5–15.5)
WBC Count: 3.8 K/uL — ABNORMAL LOW (ref 4.0–10.5)
nRBC: 0 % (ref 0.0–0.2)

## 2024-01-28 LAB — CMP (CANCER CENTER ONLY)
ALT: 13 U/L (ref 0–44)
AST: 19 U/L (ref 15–41)
Albumin: 4 g/dL (ref 3.5–5.0)
Alkaline Phosphatase: 103 U/L (ref 38–126)
Anion gap: 7 (ref 5–15)
BUN: 12 mg/dL (ref 6–20)
CO2: 26 mmol/L (ref 22–32)
Calcium: 8.9 mg/dL (ref 8.9–10.3)
Chloride: 105 mmol/L (ref 98–111)
Creatinine: 0.81 mg/dL (ref 0.44–1.00)
GFR, Estimated: >60 mL/min (ref >60)
Glucose, Bld: 89 mg/dL (ref 70–99)
Potassium: 4.3 mmol/L (ref 3.5–5.1)
Sodium: 138 mmol/L (ref 135–145)
Total Bilirubin: 0.4 mg/dL (ref 0.0–1.2)
Total Protein: 7.1 g/dL (ref 6.5–8.1)

## 2024-01-28 LAB — IRON AND IRON BINDING CAPACITY (CC-WL,HP ONLY)
Iron: 20 ug/dL — ABNORMAL LOW (ref 28–170)
Saturation Ratios: 4 % — ABNORMAL LOW (ref 10.4–31.8)
TIBC: 564 ug/dL — ABNORMAL HIGH (ref 250–450)
UIBC: 544 ug/dL — ABNORMAL HIGH (ref 148–442)

## 2024-01-28 LAB — FERRITIN: Ferritin: 7 ng/mL — ABNORMAL LOW (ref 11–307)

## 2024-01-29 ENCOUNTER — Other Ambulatory Visit

## 2024-01-29 ENCOUNTER — Ambulatory Visit: Payer: Self-pay

## 2024-01-29 ENCOUNTER — Encounter

## 2024-02-01 NOTE — Assessment & Plan Note (Signed)
 04/17/2021: Hemoglobin 5.7 (hospitalization)  06/05/2021: Hemoglobin 14.4 09/03/2021: Hemoglobin 14.7 03/04/2022: Hemoglobin 8.9, MCV 83, creatinine 0.87 04/08/2022: Hemoglobin 8.8, MCV 70.7 10/21/22: Hemoglobin 10.1, MCV 81.2, iron  studies: Iron  saturation 12%, TIBC 515, ferritin 3 05/29/2023: Hemoglobin 11.6, MCV 80.7, TIBC 507, ferritin 6 10/21/2023: Hemoglobin 8.6, MCV 75.9, platelets 489, iron  saturation 3%, TIBC 606, ferritin 4 01/28/24: Hb 9.5, MCV 75.7, Iron  sat: 4$, TIBCL:564, Ferritin 7   IV Iron : 19.24, 1/25, 10/2023   Because of her difficulty getting to the appointments I would like to request for Monoferric . She has a lot of responsibilities including taking care of her father. Labs in 3 months and telephone visit 2 days later

## 2024-02-02 ENCOUNTER — Inpatient Hospital Stay (HOSPITAL_BASED_OUTPATIENT_CLINIC_OR_DEPARTMENT_OTHER): Payer: Self-pay | Admitting: Hematology and Oncology

## 2024-02-02 DIAGNOSIS — D509 Iron deficiency anemia, unspecified: Secondary | ICD-10-CM

## 2024-02-02 NOTE — Progress Notes (Signed)
 I called the patient and left a voicemail several times and she did not return her calls. She is iron  deficient and she could benefit from IV iron  therapy.

## 2024-02-04 NOTE — Assessment & Plan Note (Signed)
 IDA due to GI Bleed: EGD: unremarkable, Capsule endoscopy Neg,   Hospitalization: 04/17/21-04/19/21 Biopsies for H.Pylori: Neg, No celiac changes   IV iron : November 2022, October 2023, September 2024, January 2025 (2 doses)   Lab review: 04/17/2021: Hemoglobin 5.7 (hospitalization)  06/05/2021: Hemoglobin 14.4 09/03/2021: Hemoglobin 14.7 03/04/2022: Hemoglobin 8.9, MCV 83, creatinine 0.87 04/08/2022: Hemoglobin 8.8, MCV 70.7 10/21/22: Hemoglobin 10.1, MCV 81.2, iron  studies: Iron  saturation 12%, TIBC 515, ferritin 3 05/29/2023: Hemoglobin 11.6, MCV 80.7, TIBC 507, ferritin 6 10/21/2023: Hemoglobin 8.6, MCV 75.9, platelets 489, iron  saturation 3%, TIBC 606, ferritin 4 01/28/24: Hb 9.5, MCV 75.7, Iron  Sat 4% TIBC 564, Ferritin 7   She currently has cravings for ice chips and severe fatigue to minimal exertion. Labs in 3 months and telephone visit 2 days later

## 2024-02-05 ENCOUNTER — Inpatient Hospital Stay (HOSPITAL_BASED_OUTPATIENT_CLINIC_OR_DEPARTMENT_OTHER): Admitting: Hematology and Oncology

## 2024-02-05 DIAGNOSIS — D509 Iron deficiency anemia, unspecified: Secondary | ICD-10-CM

## 2024-02-05 NOTE — Progress Notes (Signed)
 HEMATOLOGY-ONCOLOGY TELEPHONE VISIT PROGRESS NOTE  I connected with our patient on 02/05/24 at 11:15 AM EDT by telephone and verified that I am speaking with the correct person using two identifiers.  I discussed the limitations, risks, security and privacy concerns of performing an evaluation and management service by telephone and the availability of in person appointments.  I also discussed with the patient that there may be a patient responsible charge related to this service. The patient expressed understanding and agreed to proceed.   History of Present Illness: Telephone follow-up of iron  deficiency anemia  History of Present Illness Joan [name] is a 58 year old female with iron  deficiency anemia who presents with fatigue and ice chip cravings.  She experiences ongoing fatigue and pica, specifically craving ice chips. Recent laboratory results show a ferritin level of 7, iron  saturation of 4, and hemoglobin of 9.5. She received an iron  infusion in May, which temporarily alleviated her symptoms. Despite this, fatigue and pica persist. No visible bleeding is present.      REVIEW OF SYSTEMS:   Constitutional: Fatigue All other systems were reviewed with the patient and are negative. Observations/Objective:     Assessment Plan:  Iron  deficiency anemia IDA due to GI Bleed: EGD: unremarkable, Capsule endoscopy Neg,   Hospitalization: 04/17/21-04/19/21 Biopsies for H.Pylori: Neg, No celiac changes   IV iron : November 2022, October 2023, September 2024, January 2025 (2 doses), 11/10/2023 (Monoferric )   Lab review: 04/17/2021: Hemoglobin 5.7 (hospitalization)  06/05/2021: Hemoglobin 14.4 09/03/2021: Hemoglobin 14.7 03/04/2022: Hemoglobin 8.9, MCV 83, creatinine 0.87 04/08/2022: Hemoglobin 8.8, MCV 70.7 10/21/22: Hemoglobin 10.1, MCV 81.2, iron  studies: Iron  saturation 12%, TIBC 515, ferritin 3 05/29/2023: Hemoglobin 11.6, MCV 80.7, TIBC 507, ferritin 6 10/21/2023: Hemoglobin 8.6,  MCV 75.9, platelets 489, iron  saturation 3%, TIBC 606, ferritin 4 01/28/24: Hb 9.5, MCV 75.7, Iron  Sat 4% TIBC 564, Ferritin 7   She currently has cravings for ice chips and severe fatigue to minimal exertion. Labs in 3 months and telephone visit 2 days later       I discussed the assessment and treatment plan with the patient. The patient was provided an opportunity to ask questions and all were answered. The patient agreed with the plan and demonstrated an understanding of the instructions. The patient was advised to call back or seek an in-person evaluation if the symptoms worsen or if the condition fails to improve as anticipated.   I provided 20 minutes of non-face-to-face time during this encounter.  This includes time for charting and coordination of care   Naomi MARLA Chad, MD

## 2024-02-06 ENCOUNTER — Other Ambulatory Visit: Payer: Self-pay

## 2024-02-06 ENCOUNTER — Telehealth: Payer: Self-pay

## 2024-02-06 DIAGNOSIS — R928 Other abnormal and inconclusive findings on diagnostic imaging of breast: Secondary | ICD-10-CM | POA: Insufficient documentation

## 2024-02-06 NOTE — Telephone Encounter (Signed)
 Dr. Gudena, patient will be scheduled as soon as possible.  Patient is receiving Advance Medication - Supplied Externally. Medication: MonoFerric  Manufacture: [employer] Approval Dates: Approved from 10/29/2023 until 10/28/2024. ID: [id number] Reason: Self Pay

## 2024-02-09 ENCOUNTER — Telehealth: Payer: Self-pay

## 2024-02-09 NOTE — Telephone Encounter (Addendum)
 Telephoned patient at home number. Left a voice message with [facility] scheduling contact information.  Telephoned patient's sister and left a voice message. [facility] 02/09/2024

## 2024-02-16 ENCOUNTER — Ambulatory Visit (INDEPENDENT_AMBULATORY_CARE_PROVIDER_SITE_OTHER): Payer: Self-pay

## 2024-02-16 VITALS — BP 118/81 | HR 88 | Temp 97.6°F | Resp 18 | Ht 64.0 in | Wt 137.0 lb

## 2024-02-16 DIAGNOSIS — D509 Iron deficiency anemia, unspecified: Secondary | ICD-10-CM

## 2024-02-16 MED ORDER — SODIUM CHLORIDE 0.9 % IV SOLN
1000.0000 mg | Freq: Once | INTRAVENOUS | Status: AC
  Administered 2024-02-16: 1000 mg via INTRAVENOUS
  Filled 2024-02-16: qty 10

## 2024-02-16 NOTE — Progress Notes (Signed)
 Diagnosis: Iron  Deficiency Anemia  Provider:  Praveen Mannam MD  Procedure: IV Infusion  IV Type: Peripheral, IV Location: L Hand  Monoferric  (Ferric Derisomaltose ), Dose: 1000 mg  Infusion Start Time: 1552  Infusion Stop Time: 1618  Post Infusion IV Care: Patient declined observation and Peripheral IV Discontinued  Discharge: Condition: Good, Destination: Home . AVS Declined  Performed by:  Shamel Germond, RN

## 2024-02-16 NOTE — Patient Instructions (Signed)

## 2024-03-25 ENCOUNTER — Other Ambulatory Visit: Payer: Self-pay | Admitting: Obstetrics and Gynecology

## 2024-03-25 ENCOUNTER — Other Ambulatory Visit (HOSPITAL_COMMUNITY): Payer: Self-pay | Admitting: Pharmacy Technician

## 2024-03-25 ENCOUNTER — Encounter: Payer: Self-pay | Admitting: Hematology and Oncology

## 2024-03-25 ENCOUNTER — Ambulatory Visit: Payer: Self-pay | Admitting: *Deleted

## 2024-03-25 ENCOUNTER — Ambulatory Visit
Admission: RE | Admit: 2024-03-25 | Discharge: 2024-03-25 | Disposition: A | Source: Ambulatory Visit | Attending: Obstetrics and Gynecology | Admitting: Obstetrics and Gynecology

## 2024-03-25 ENCOUNTER — Ambulatory Visit
Admission: RE | Admit: 2024-03-25 | Discharge: 2024-03-25 | Disposition: A | Discharge status: Home / Self Care | Source: Ambulatory Visit | Attending: Obstetrics and Gynecology | Admitting: Obstetrics and Gynecology

## 2024-03-25 VITALS — Wt 130.0 lb

## 2024-03-25 DIAGNOSIS — N6489 Other specified disorders of breast: Secondary | ICD-10-CM

## 2024-03-25 DIAGNOSIS — Z01419 Encounter for gynecological examination (general) (routine) without abnormal findings: Secondary | ICD-10-CM

## 2024-03-25 NOTE — Patient Instructions (Signed)
 Explained breast self awareness with Joan [name]. Pap sear completed today. Let her know that based on her history of abnormal Pap smears that her next Pap smear will be due in one year if normal with negative HPV today. Referred patient to the [facility] for a left breast diagnostic mammogram per recommendation. Appointment scheduled Thursday, March 25, 2024 at 1240. Patient aware of appointment and will be there. Let patient know will follow up with her within the next couple weeks with results of Pap smear by letter or phone. Joan [name] verbalized understanding.  Jenna Routzahn, Wanda Ship, RN 12:09 PM

## 2024-03-25 NOTE — Progress Notes (Signed)
 Ms. LOWANDA CASHAW is a 58 y.o. No obstetric history on file. female who presents to [facility] clinic today with no complaints. Patient had a screening mammogram completed 09/18/2023 that additional imaging of the left breast is recommended for follow up.   Pap Smear: Pap smear completed today. Last Pap smear was 06/27/2022 at [facility]  clinic and was abnormal - ASCUS with positive HPV. Per patient has history of multiple abnormal Pap smears. Patient has history of abnormal Pap smear 04/17/2021 that was normal with positive HPV that no follow up was completed, 08/03/2018 that was normal with positive HPV that no follow up was completed, and around 30 years that she stated she had three other abnormal Pap smears that she had cryotherapy x 2 completed for follow up. Last Pap smear result is available in Epic.   Physical exam: Breasts Breasts symmetrical. No skin abnormalities bilateral breasts. No nipple retraction bilateral breasts. No nipple discharge bilateral breasts. No lymphadenopathy. No lumps palpated bilateral breasts. No complaints of pain or tenderness on exam.       Pelvic/Bimanual Ext Genitalia No lesions, no swelling and no discharge observed on external genitalia.        Vagina Vagina pink and normal texture. No lesions or discharge observed in vagina.        Cervix Cervix is present. Cervix pink and of normal texture. No discharge observed.    Uterus Uterus is present and palpable. Uterus in normal position and normal size.        Adnexae Bilateral ovaries present and palpable. No tenderness on palpation.         Rectovaginal No rectal exam completed today since patient had no rectal complaints. No skin abnormalities observed on exam.     Smoking History: Patient has never smoked.   Patient Navigation: Patient education provided. Access to services provided for patient through [facility] program.   Colorectal Cancer Screening: Per patient has had colonoscopy completed on  04/14/2021 at [facility]. No complaints today.    Breast and Cervical Cancer Risk Assessment: Patient has family history of  her paternal grandmother having breast cancer. Patient has no known genetic mutations or history of radiation treatment to the chest before age 70. Patient has history of cervical dysplasia. Patient has no history of being immunocompromised or DES exposure in-utero.  Risk Scores as of Encounter on 03/25/2024     Alisa           5-year 0.88%   Lifetime 5.11%            Last calculated by Driscilla Wanda SQUIBB, RN on 03/25/2024 at 10:45 PM       A: [facility] exam with pap smear No complaints.  P: Referred patient to the [facility] for a left breast diagnostic mammogram per recommendation. Appointment scheduled Thursday, March 25, 2024 at 1240.  Driscilla Wanda SQUIBB, RN 03/25/2024 12:09 PM

## 2024-03-26 LAB — CYTOLOGY - PAP
Comment: NEGATIVE
Diagnosis: NEGATIVE
High risk HPV: NEGATIVE

## 2024-03-31 ENCOUNTER — Ambulatory Visit: Payer: Self-pay

## 2024-04-06 ENCOUNTER — Encounter: Admitting: Obstetrics and Gynecology

## 2024-04-06 ENCOUNTER — Encounter: Payer: Self-pay | Admitting: Obstetrics and Gynecology

## 2024-04-08 ENCOUNTER — Telehealth: Payer: Self-pay | Admitting: Internal Medicine

## 2024-04-08 NOTE — Telephone Encounter (Signed)
 Patient would like to be seen, patient states she had a pap smear done last Thursday and ever since then she has been experiencing pain on her lower abdomen, pain feels more to the right side,    We will call patient if there is a cancellation.

## 2024-04-12 NOTE — Progress Notes (Signed)
 Patient did not keep her colposcopy appointment for 04/06/2024.  I believe this was incorrectly scheduled. She had a 03/25/24 NILM/HPV neg with 06/27/22 pap that showed: atypical squamous cells: atrophic pattern with epithelial atypia;  HPV+ (no subtyping). Prior pap cytology appointments:  07/03/2021 colpo visit: no colpo done>>co-testing in one year recommended.  04/17/21 pap: NILM, HPV+ (no subtyping)  I'll send a message to [facility] that I recommend she repeat a pap and hpv in October 2026.  Bebe Izell Raddle MD Attending Center for [employer] [facility])

## 2024-04-13 NOTE — Telephone Encounter (Signed)
 Called patient to offer appointment, no answer and unable to lvm 04/13/24

## 2024-04-15 NOTE — Telephone Encounter (Signed)
 Called patient to offer appointment, no answer and unable to lvm 04/15/24

## 2024-04-22 NOTE — Telephone Encounter (Signed)
 Called patient to offer appointment, no answer and unable to lvm 04/22/24.

## 2024-05-10 ENCOUNTER — Inpatient Hospital Stay: Attending: Hematology and Oncology

## 2024-05-12 ENCOUNTER — Inpatient Hospital Stay: Admitting: Hematology and Oncology

## 2024-05-18 ENCOUNTER — Inpatient Hospital Stay

## 2024-05-18 DIAGNOSIS — D509 Iron deficiency anemia, unspecified: Secondary | ICD-10-CM

## 2024-05-18 LAB — CBC WITH DIFFERENTIAL (CANCER CENTER ONLY)
Abs Immature Granulocytes: 0.01 K/uL (ref 0.00–0.07)
Basophils Absolute: 0 K/uL (ref 0.0–0.1)
Basophils Relative: 1 %
Eosinophils Absolute: 0.2 K/uL (ref 0.0–0.5)
Eosinophils Relative: 4 %
HCT: 41.6 % (ref 36.0–46.0)
Hemoglobin: 13.8 g/dL (ref 12.0–15.0)
Immature Granulocytes: 0 %
Lymphocytes Relative: 35 %
Lymphs Abs: 1.4 K/uL (ref 0.7–4.0)
MCH: 28.3 pg (ref 26.0–34.0)
MCHC: 33.2 g/dL (ref 30.0–36.0)
MCV: 85.2 fL (ref 80.0–100.0)
Monocytes Absolute: 0.4 K/uL (ref 0.1–1.0)
Monocytes Relative: 10 %
Neutro Abs: 2 K/uL (ref 1.7–7.7)
Neutrophils Relative %: 50 %
Platelet Count: 342 K/uL (ref 150–400)
RBC: 4.88 MIL/uL (ref 3.87–5.11)
RDW: 17 % — ABNORMAL HIGH (ref 11.5–15.5)
WBC Count: 4 K/uL (ref 4.0–10.5)
nRBC: 0 % (ref 0.0–0.2)

## 2024-05-18 LAB — IRON AND IRON BINDING CAPACITY (CC-WL,HP ONLY)
Iron: 34 ug/dL (ref 28–170)
Saturation Ratios: 10 % — ABNORMAL LOW (ref 10.4–31.8)
TIBC: 342 ug/dL (ref 250–450)
UIBC: 308 ug/dL

## 2024-05-18 LAB — FERRITIN: Ferritin: 56 ng/mL (ref 11–307)

## 2024-06-01 ENCOUNTER — Telehealth: Payer: Self-pay | Admitting: *Deleted

## 2024-06-01 ENCOUNTER — Telehealth: Payer: Self-pay | Admitting: Hematology and Oncology

## 2024-06-01 NOTE — Telephone Encounter (Signed)
 Received VM from pt requesting to review recent iron  labs with MD.  Pt was scheduled for a f/u but had canceled.  RN attempt x1 to return call to schedule pt, no answer, LVM for pt to return to call to the office.

## 2024-06-01 NOTE — Telephone Encounter (Signed)
 Left vm for pt to call back and reschedule missed f/u appt

## 2024-06-23 ENCOUNTER — Telehealth: Payer: Self-pay

## 2024-06-23 NOTE — Telephone Encounter (Signed)
 Error - duplicate

## 2024-06-23 NOTE — Telephone Encounter (Signed)
 Pt called and LVM asking for call back to review labs. Attempted to call pt but no answer and no VM available to leave message.

## 2024-07-22 ENCOUNTER — Ambulatory Visit: Payer: Self-pay | Admitting: Internal Medicine

## 2024-09-22 ENCOUNTER — Ambulatory Visit: Payer: Self-pay | Admitting: Internal Medicine

## 2025-01-21 ENCOUNTER — Other Ambulatory Visit: Payer: Self-pay

## 2025-01-24 ENCOUNTER — Encounter: Payer: Self-pay | Admitting: Internal Medicine
